# Patient Record
Sex: Female | Born: 1965 | Race: Black or African American | Hispanic: No | Marital: Single | Smoking: Current every day smoker
Health system: Southern US, Community
[De-identification: ages and names within clinical notes are randomized; demographics above are authoritative.]

## PROBLEM LIST (undated history)

## (undated) DIAGNOSIS — G5603 Carpal tunnel syndrome, bilateral upper limbs: Secondary | ICD-10-CM

## (undated) DIAGNOSIS — Z87898 Personal history of other specified conditions: Secondary | ICD-10-CM

## (undated) DIAGNOSIS — I10 Essential (primary) hypertension: Secondary | ICD-10-CM

## (undated) DIAGNOSIS — G8929 Other chronic pain: Secondary | ICD-10-CM

## (undated) DIAGNOSIS — R739 Hyperglycemia, unspecified: Secondary | ICD-10-CM

## (undated) HISTORY — DX: Personal history of other specified conditions: Z87.898

## (undated) HISTORY — DX: Hyperglycemia, unspecified: R73.9

## (undated) HISTORY — DX: Other chronic pain: G89.29

## (undated) HISTORY — PX: EYE SURGERY: SHX253

## (undated) HISTORY — DX: Carpal tunnel syndrome, bilateral upper limbs: G56.03

---

## 1999-11-24 ENCOUNTER — Encounter: Payer: Self-pay | Admitting: Emergency Medicine

## 1999-11-24 ENCOUNTER — Emergency Department (HOSPITAL_COMMUNITY): Admission: EM | Admit: 1999-11-24 | Discharge: 1999-11-24 | Payer: Self-pay | Admitting: *Deleted

## 2002-08-11 ENCOUNTER — Emergency Department (HOSPITAL_COMMUNITY): Admission: EM | Admit: 2002-08-11 | Discharge: 2002-08-11 | Payer: Self-pay | Admitting: Emergency Medicine

## 2002-08-30 ENCOUNTER — Other Ambulatory Visit: Admission: RE | Admit: 2002-08-30 | Discharge: 2002-08-30 | Payer: Self-pay | Admitting: Obstetrics and Gynecology

## 2002-09-11 ENCOUNTER — Encounter: Payer: Self-pay | Admitting: Obstetrics and Gynecology

## 2002-09-11 ENCOUNTER — Encounter: Admission: RE | Admit: 2002-09-11 | Discharge: 2002-09-11 | Payer: Self-pay | Admitting: Obstetrics and Gynecology

## 2004-12-19 ENCOUNTER — Emergency Department (HOSPITAL_COMMUNITY): Admission: EM | Admit: 2004-12-19 | Discharge: 2004-12-19 | Payer: Self-pay | Admitting: Emergency Medicine

## 2005-06-22 ENCOUNTER — Emergency Department (HOSPITAL_COMMUNITY): Admission: EM | Admit: 2005-06-22 | Discharge: 2005-06-22 | Payer: Self-pay | Admitting: Emergency Medicine

## 2007-07-15 ENCOUNTER — Encounter: Admission: RE | Admit: 2007-07-15 | Discharge: 2007-07-15 | Payer: Self-pay | Admitting: Family Medicine

## 2010-11-24 ENCOUNTER — Encounter: Payer: Self-pay | Admitting: *Deleted

## 2011-04-01 ENCOUNTER — Emergency Department (HOSPITAL_COMMUNITY)
Admission: EM | Admit: 2011-04-01 | Discharge: 2011-04-01 | Disposition: A | Discharge status: Home / Self Care | Payer: Self-pay | Attending: Internal Medicine | Admitting: Internal Medicine

## 2011-04-01 ENCOUNTER — Emergency Department (HOSPITAL_COMMUNITY): Payer: Self-pay

## 2011-04-01 DIAGNOSIS — I10 Essential (primary) hypertension: Secondary | ICD-10-CM | POA: Insufficient documentation

## 2011-04-01 DIAGNOSIS — R51 Headache: Secondary | ICD-10-CM | POA: Insufficient documentation

## 2011-04-01 LAB — URINALYSIS, ROUTINE W REFLEX MICROSCOPIC
Bilirubin Urine: NEGATIVE
Glucose, UA: NEGATIVE mg/dL
Ketones, ur: NEGATIVE mg/dL
pH: 7 (ref 5.0–8.0)

## 2011-04-01 LAB — BASIC METABOLIC PANEL
BUN: 13 mg/dL (ref 6–23)
Chloride: 100 mEq/L (ref 96–112)
Glucose, Bld: 91 mg/dL (ref 70–99)
Potassium: 3.5 mEq/L (ref 3.5–5.1)

## 2011-05-07 ENCOUNTER — Other Ambulatory Visit: Payer: Self-pay | Admitting: Obstetrics & Gynecology

## 2011-05-07 DIAGNOSIS — Z1231 Encounter for screening mammogram for malignant neoplasm of breast: Secondary | ICD-10-CM

## 2011-05-18 ENCOUNTER — Ambulatory Visit (HOSPITAL_COMMUNITY)
Admission: RE | Admit: 2011-05-18 | Discharge: 2011-05-18 | Disposition: A | Discharge status: Home / Self Care | Payer: Self-pay | Source: Ambulatory Visit | Attending: Obstetrics & Gynecology | Admitting: Obstetrics & Gynecology

## 2011-05-18 DIAGNOSIS — Z1231 Encounter for screening mammogram for malignant neoplasm of breast: Secondary | ICD-10-CM

## 2012-07-12 IMAGING — CR DG CHEST 2V
2 series · 2 of 2 positions shown · non-contrast
Comparison: None.

CLINICAL DATA: Hypertension/headache

CHEST - 2 VIEW

[w chest pa]
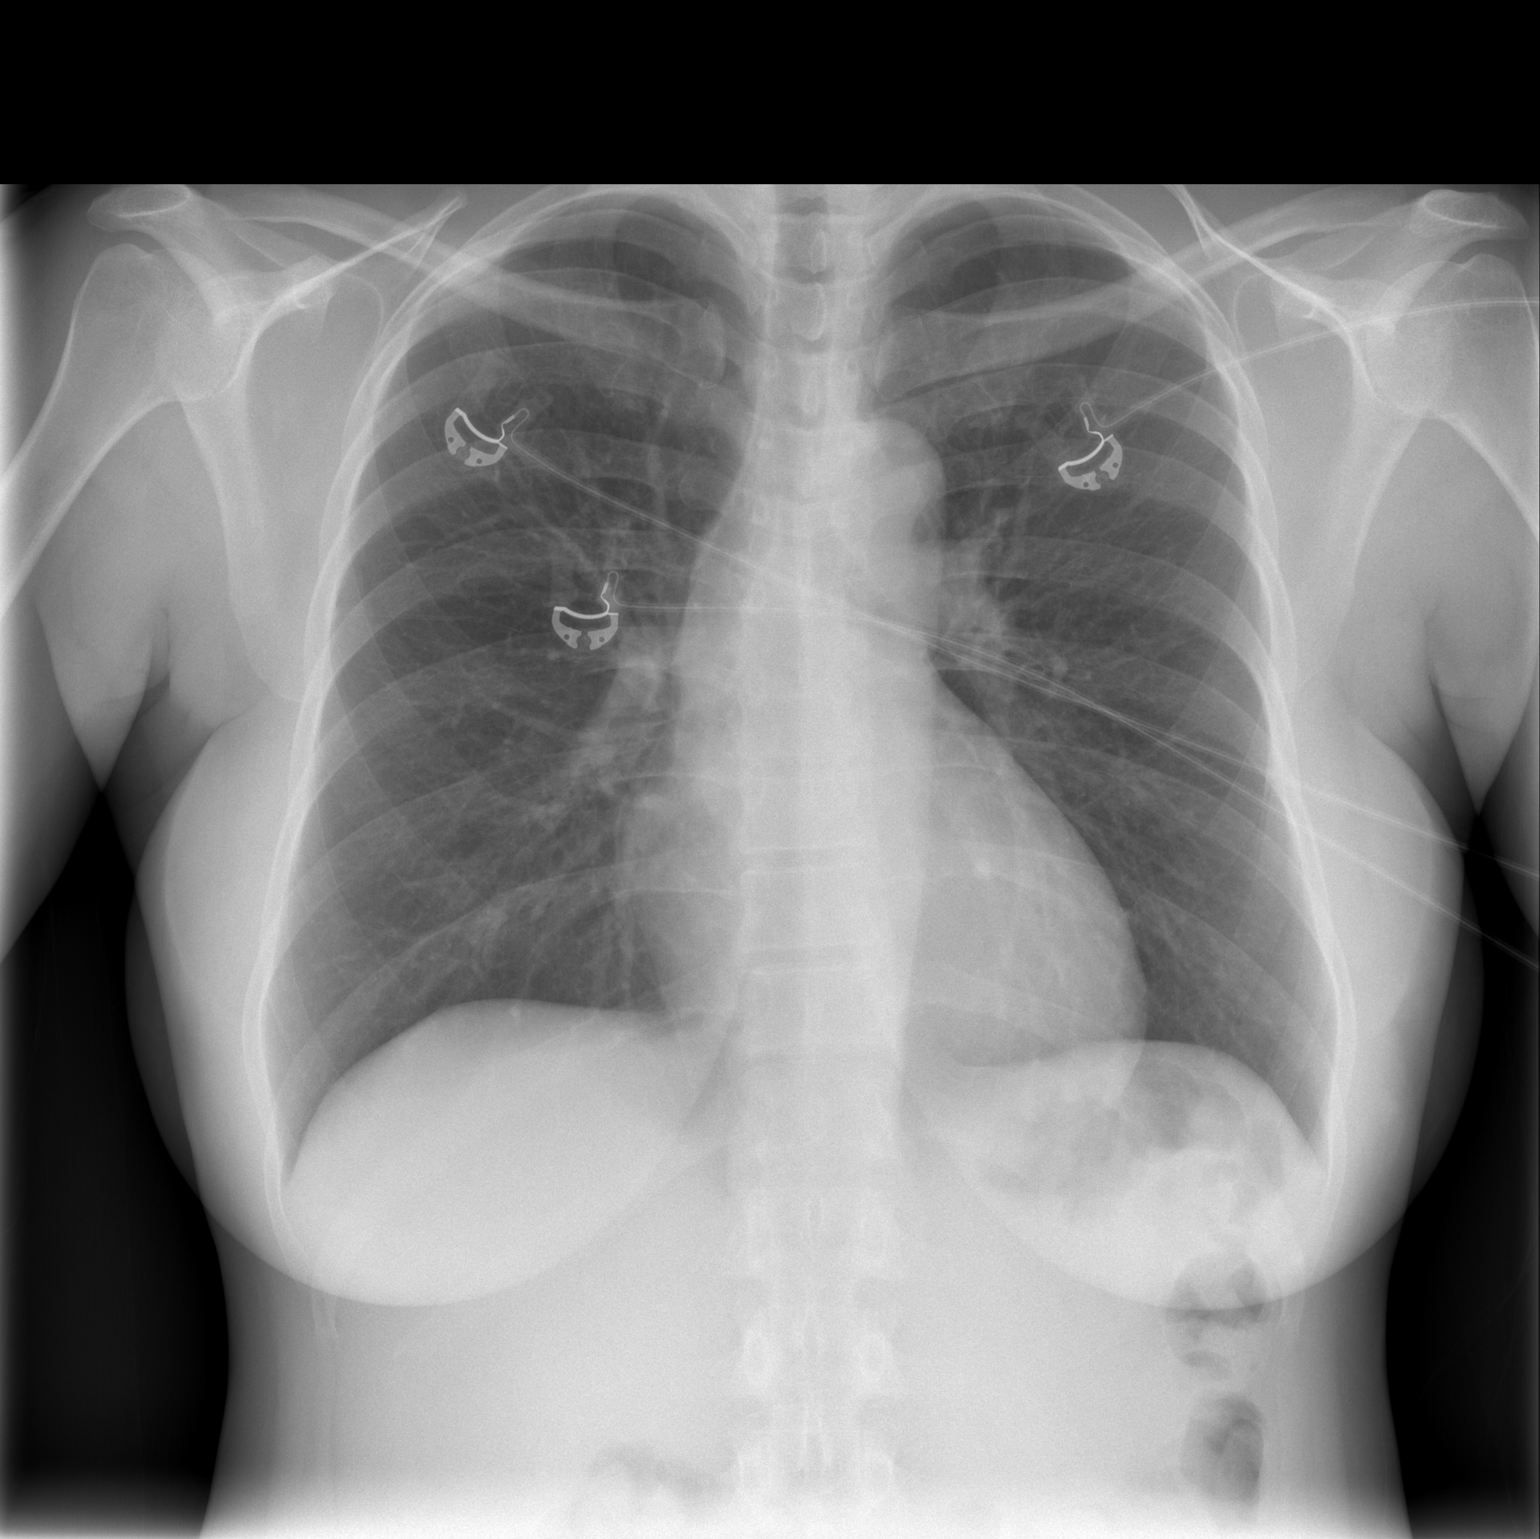

[w chest lat]
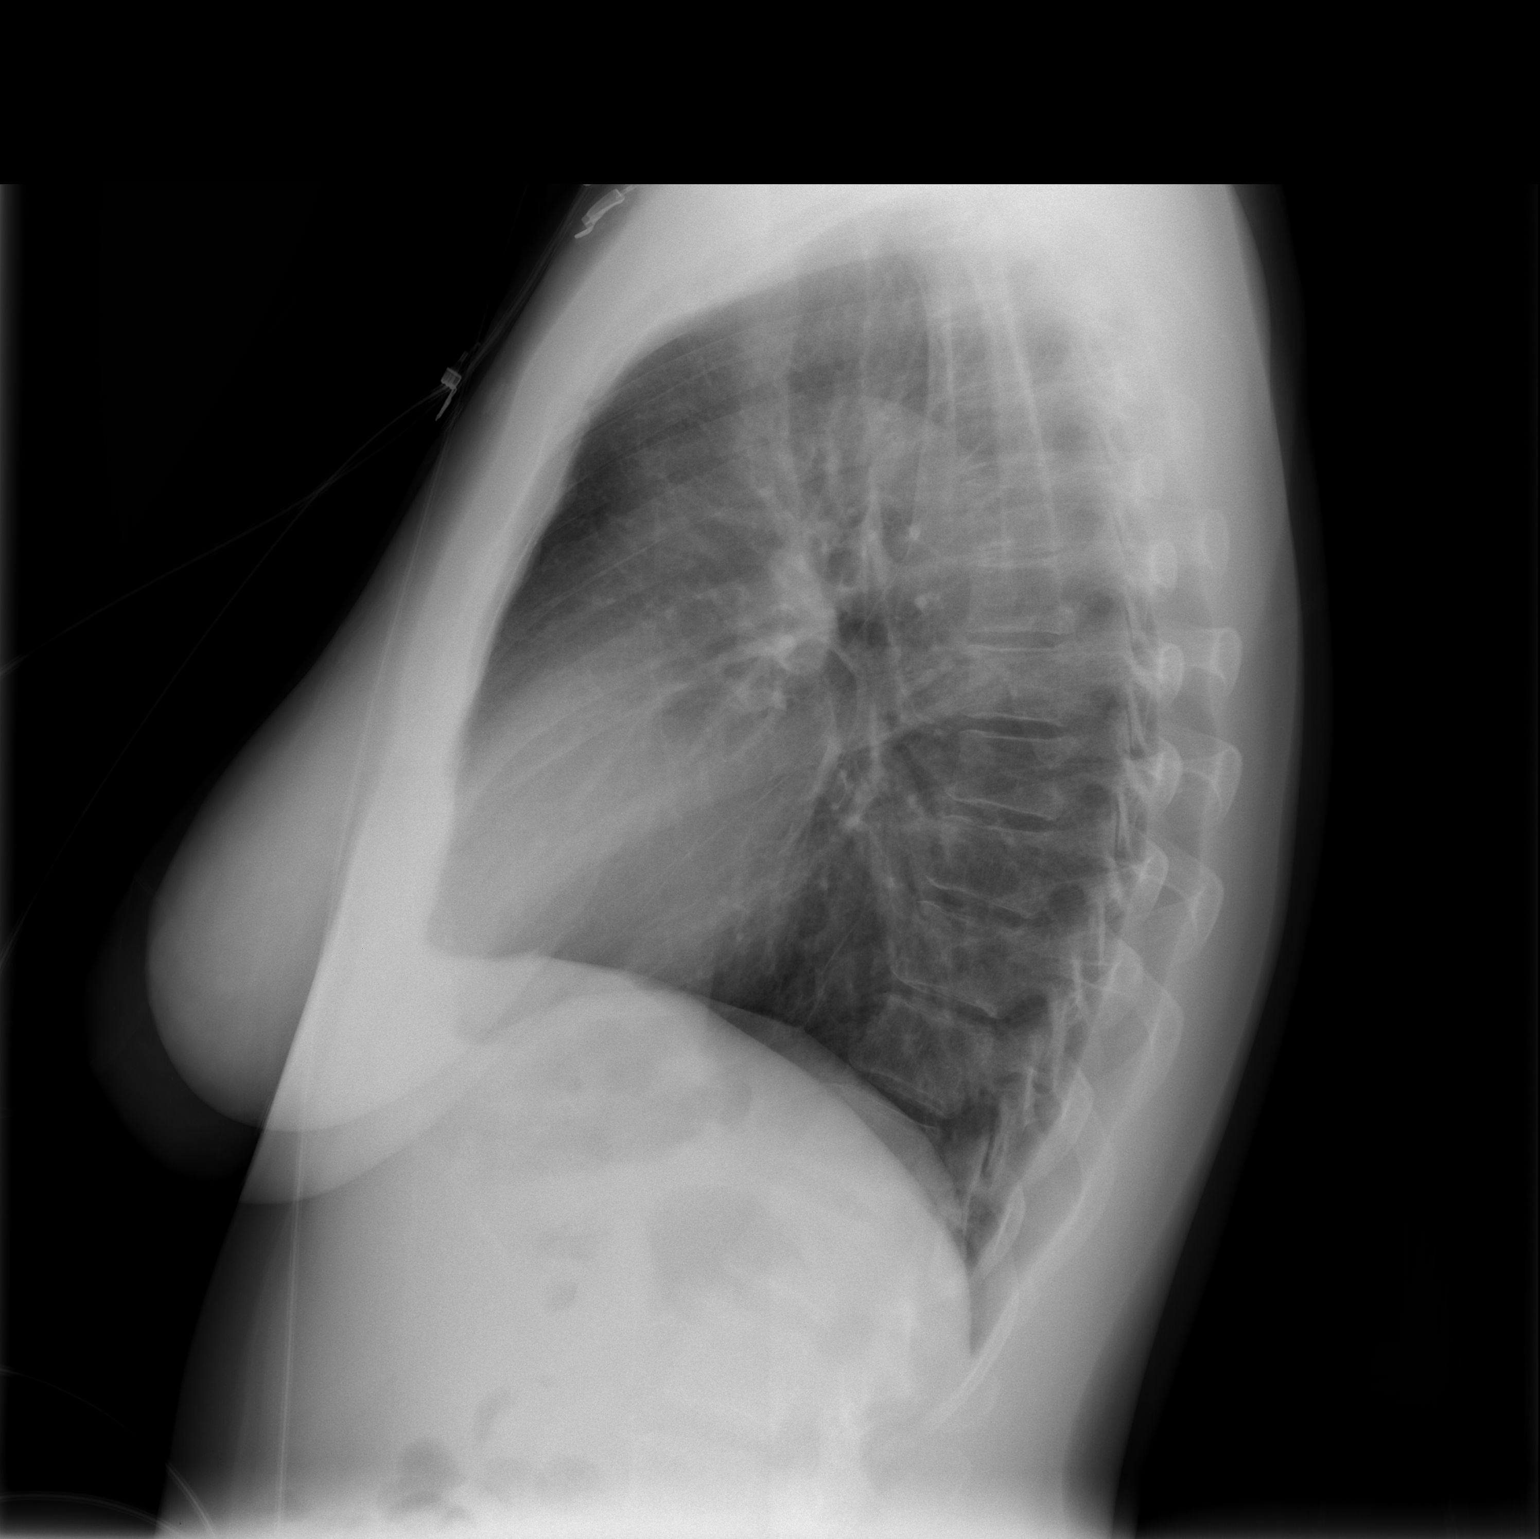

[2 of 2 positions shown; findings below may reference images not displayed]

FINDINGS: Heart and mediastinal contours normal.  Lungs clear.  No
pleural fluid.  Osseous structures and soft tissues unremarkable.
IMPRESSION: No acute or significant findings.

## 2012-07-12 IMAGING — CT CT HEAD W/O CM
1 of 2 series · 16 of 30 positions shown, 20 images · non-contrast
Comparison: None.

CLINICAL DATA: Hypertension/headache

CT HEAD WITHOUT CONTRAST
TECHNIQUE: Contiguous axial images were obtained from the base of
the skull through the vertex without contrast.

[Series 2: head_seq 4.5 h37s st · axial · 0.43mm/px · z∈[-163,-19]mm · 16 of 36 slices shown, 20 images]
[im 2/36  brain]
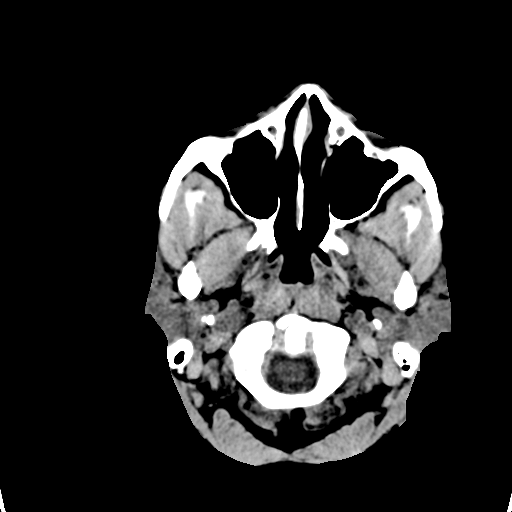
[im 2/36  bone]
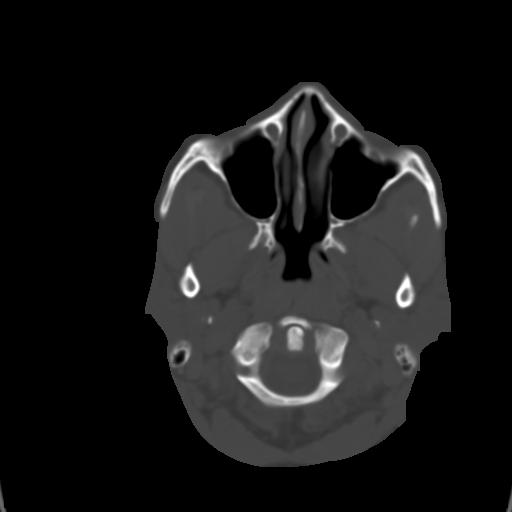
[im 4/36  brain]
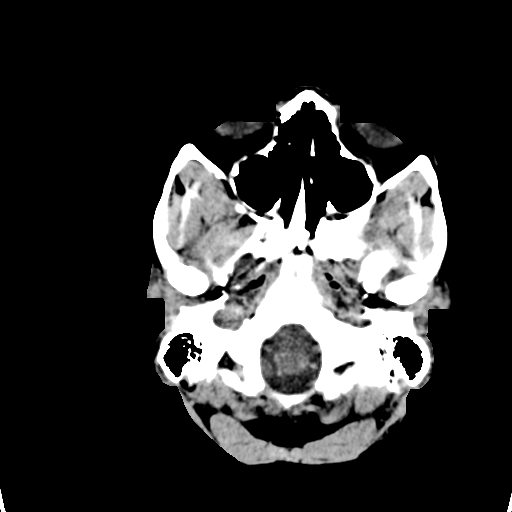
[im 6/36  brain]
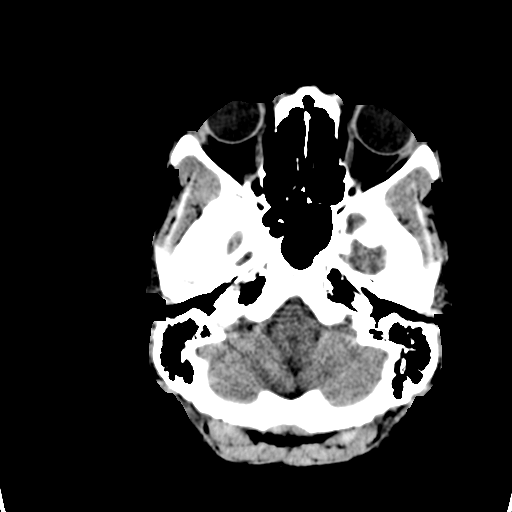
[im 9/36  brain]
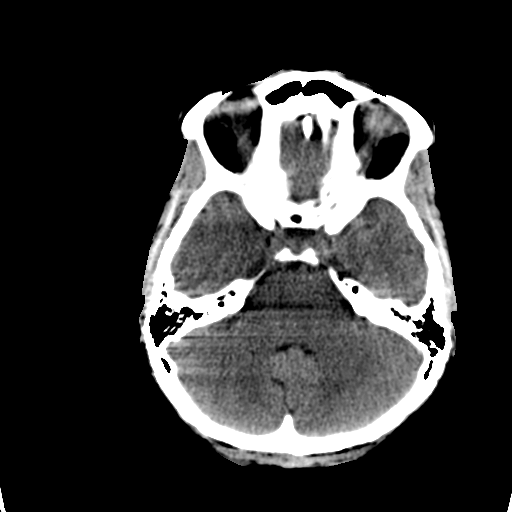
[im 11/36  brain]
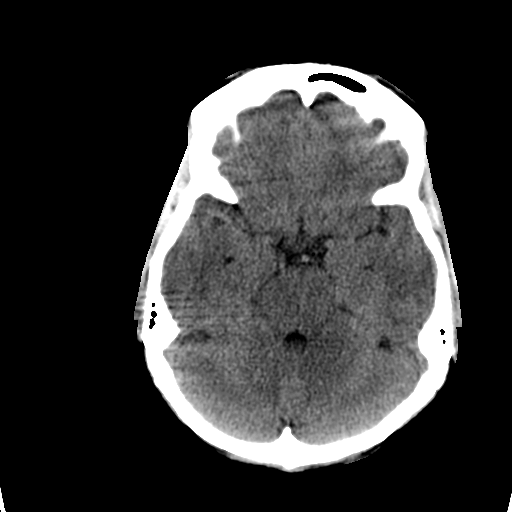
[im 11/36  bone]
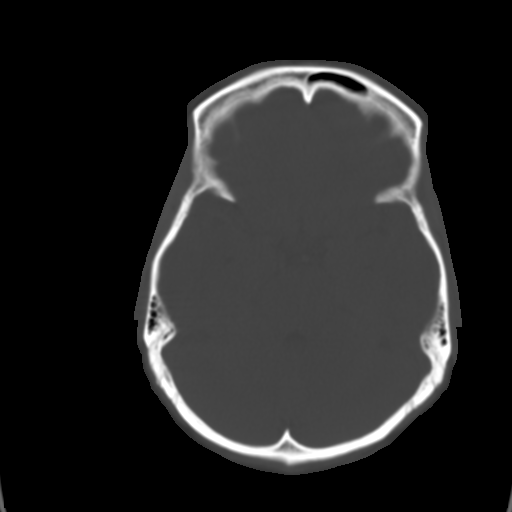
[im 13/36  brain]
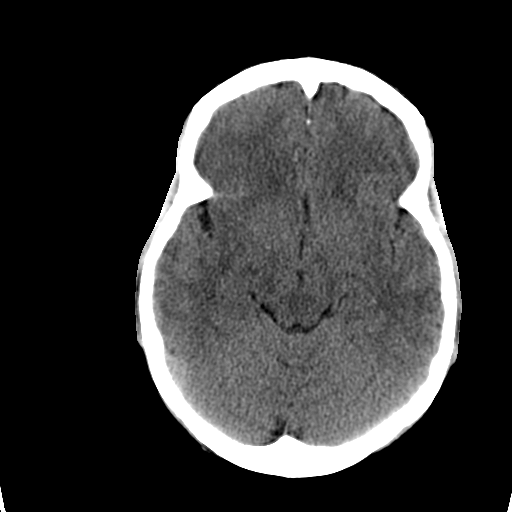
[im 15/36  brain]
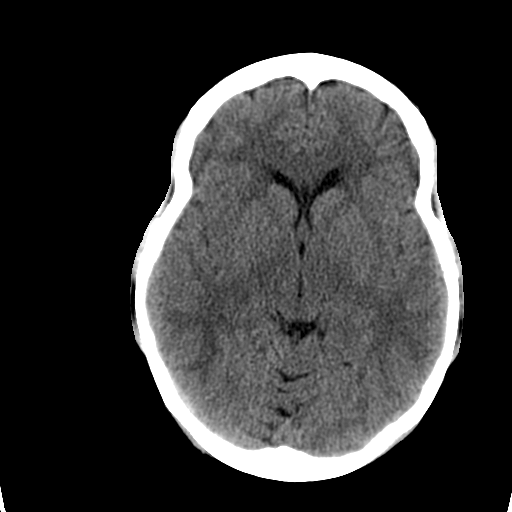
[im 16/36  brain]
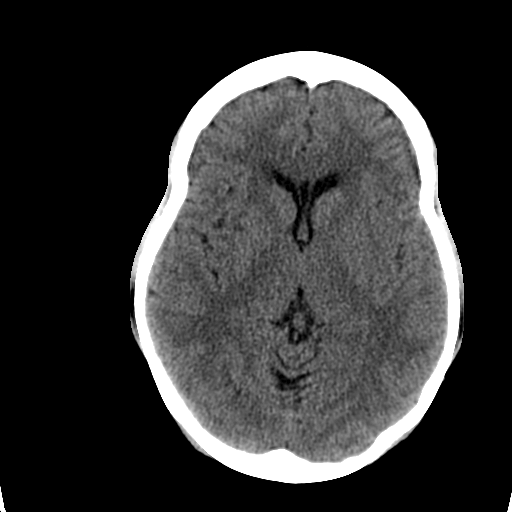
[im 20/36  brain]
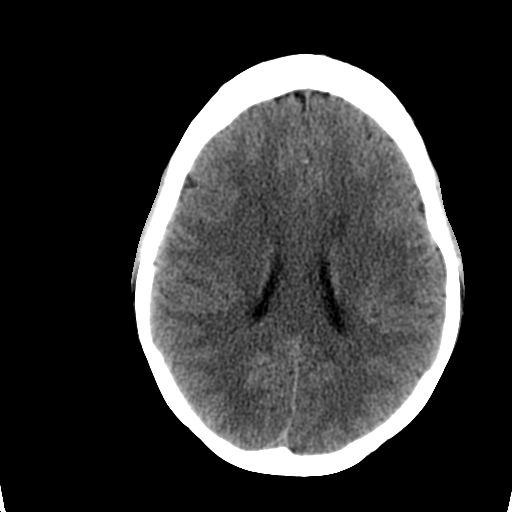
[im 20/36  bone]
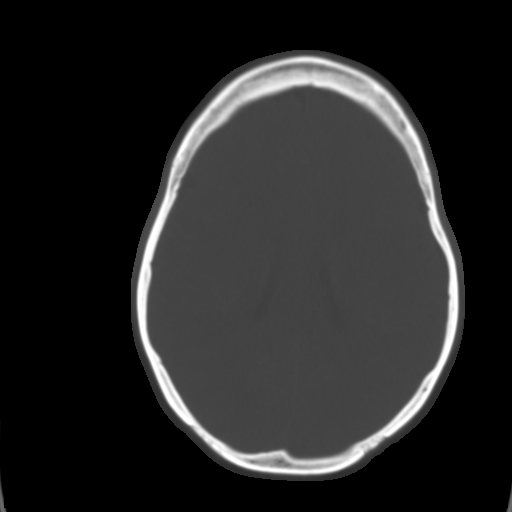
[im 22/36  brain]
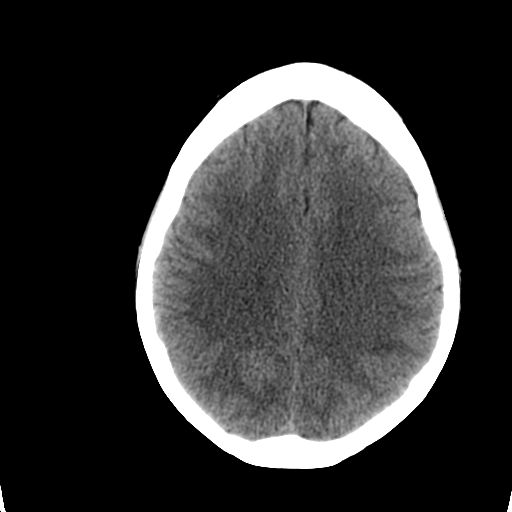
[im 23/36  brain]
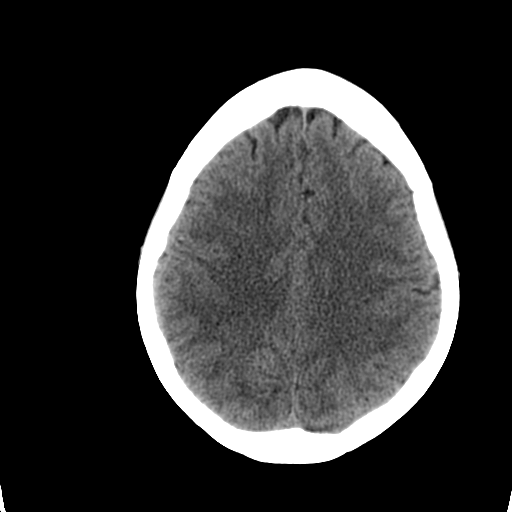
[im 25/36  brain]
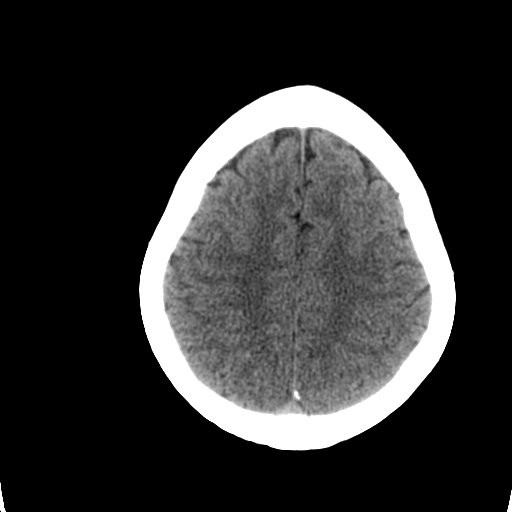
[im 27/36  brain]
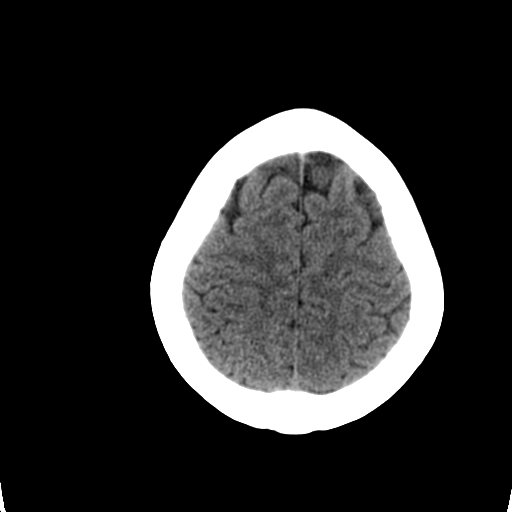
[im 27/36  bone]
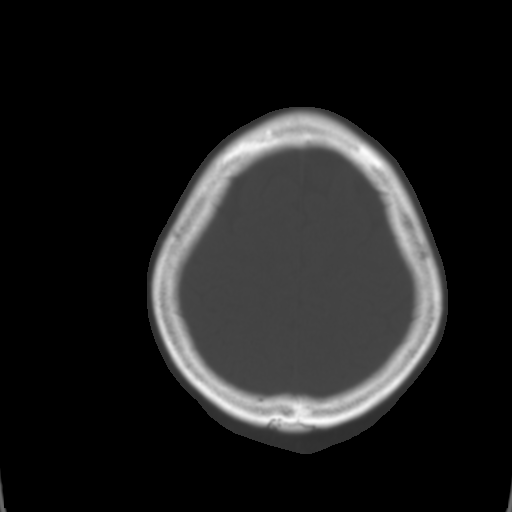
[im 30/36  brain]
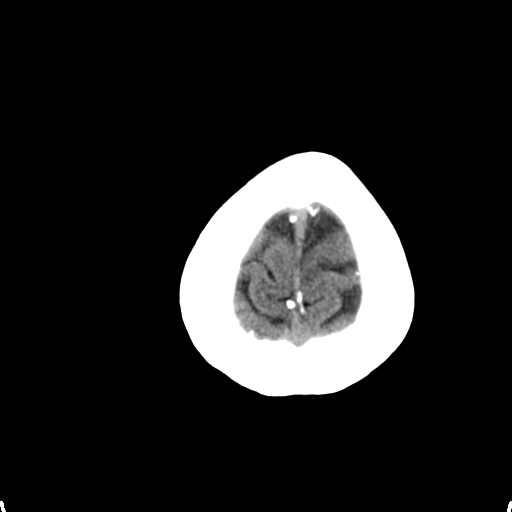
[im 32/36  brain]
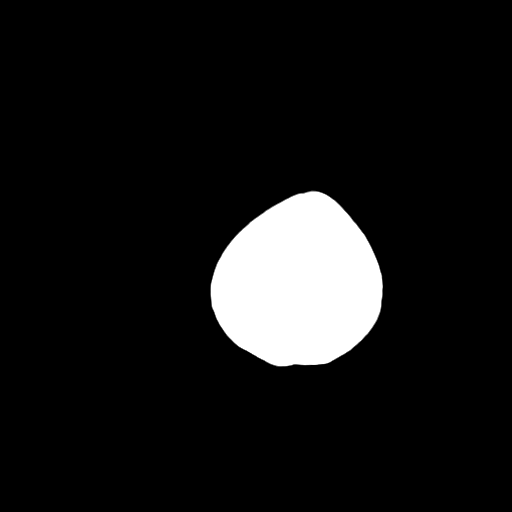
[im 34/36  brain]
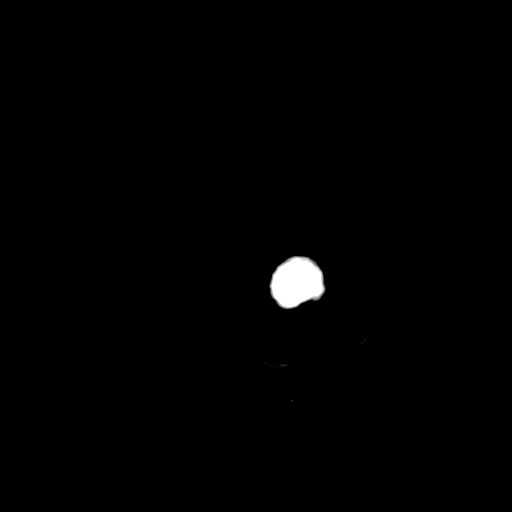

[16 of 30 positions shown; findings below may reference images not displayed]

FINDINGS: Ventricular size and CSF spaces normal.

 No evidence for acute infarct, hemorrhage, or mass lesion. No
extra-axial fluid collections or midline shift.  Calvarium intact.
No fluid in the sinuses visualized.
IMPRESSION: No acute or significant findings.

## 2012-07-15 ENCOUNTER — Other Ambulatory Visit (HOSPITAL_COMMUNITY): Payer: Self-pay | Admitting: Physician Assistant

## 2012-07-15 DIAGNOSIS — Z1231 Encounter for screening mammogram for malignant neoplasm of breast: Secondary | ICD-10-CM

## 2012-07-25 ENCOUNTER — Ambulatory Visit (HOSPITAL_COMMUNITY): Payer: Self-pay

## 2012-08-04 ENCOUNTER — Ambulatory Visit (HOSPITAL_COMMUNITY)
Admission: RE | Admit: 2012-08-04 | Discharge: 2012-08-04 | Disposition: A | Discharge status: Home / Self Care | Payer: Self-pay | Source: Ambulatory Visit | Attending: Physician Assistant | Admitting: Physician Assistant

## 2012-08-04 DIAGNOSIS — Z1231 Encounter for screening mammogram for malignant neoplasm of breast: Secondary | ICD-10-CM

## 2012-08-24 ENCOUNTER — Emergency Department (HOSPITAL_COMMUNITY)
Admission: EM | Admit: 2012-08-24 | Discharge: 2012-08-24 | Disposition: A | Discharge status: Home / Self Care | Payer: Self-pay | Attending: Emergency Medicine | Admitting: Emergency Medicine

## 2012-08-24 ENCOUNTER — Encounter (HOSPITAL_COMMUNITY): Payer: Self-pay | Admitting: Emergency Medicine

## 2012-08-24 DIAGNOSIS — R21 Rash and other nonspecific skin eruption: Secondary | ICD-10-CM | POA: Insufficient documentation

## 2012-08-24 DIAGNOSIS — F17200 Nicotine dependence, unspecified, uncomplicated: Secondary | ICD-10-CM | POA: Insufficient documentation

## 2012-08-24 DIAGNOSIS — I10 Essential (primary) hypertension: Secondary | ICD-10-CM | POA: Insufficient documentation

## 2012-08-24 HISTORY — DX: Essential (primary) hypertension: I10

## 2012-08-24 MED ORDER — DOXYCYCLINE HYCLATE 100 MG PO CAPS: 100.0000 mg | ORAL_CAPSULE | Freq: Two times a day (BID) | ORAL | Status: DC

## 2012-08-24 MED ORDER — DIPHENHYDRAMINE HCL 25 MG PO TABS: 25.0000 mg | ORAL_TABLET | Freq: Four times a day (QID) | ORAL | Status: DC

## 2012-08-24 MED ORDER — PREDNISONE 10 MG PO TABS: 20.0000 mg | ORAL_TABLET | Freq: Every day | ORAL | Status: DC

## 2012-08-24 NOTE — ED Provider Notes (Signed)
History     CSN: [id number]  Arrival date & time 08/24/12  0944   First MD Initiated Contact with Patient 08/24/12 1007      No chief complaint on file.   (Consider location/radiation/quality/duration/timing/severity/associated sxs/prior treatment) HPI  46 year old female presents complaining of rash. Patient reports for the past 6 months she has had a persistent rash. Rash developed initially on her bilateral forearm and has spread throughout her back abdomen and her lower legs. She is very itchy. Onset was gradual, persistent, worsening, with moderate severity. She denies headache, fever, nausea, vomiting, diarrhea, abdominal pain. She denies any change in her environment, new detergent, or new cleaning products. He has been seen by her primary care Dr. for this problem. She was prescribed prednisone, antibiotic, and cream which has not provided any relief.  No one else in family with similar rash.  No past medical history on file.  No past surgical history on file.  No family history on file.  History  Substance Use Topics  . Smoking status: Not on file  . Smokeless tobacco: Not on file  . Alcohol Use: Not on file    OB History    No data available      Review of Systems  Constitutional: Negative for fever.  HENT: Negative for sore throat.   Respiratory: Negative for shortness of breath.   Cardiovascular: Negative for chest pain.  Gastrointestinal: Negative for nausea, vomiting and abdominal pain.  Skin: Positive for rash.  Neurological: Negative for numbness.    Allergies  Review of patient's allergies indicates no known allergies.  Home Medications  No current outpatient prescriptions on file.  LMP 07/21/2012  Physical Exam  Nursing note and vitals reviewed. Constitutional: She is oriented to person, place, and time. She appears well-developed and well-nourished. No distress.  HENT:  Head: Normocephalic and atraumatic.  Mouth/Throat: Oropharynx is clear  and moist. No oropharyngeal exudate.  Eyes: Conjunctivae normal are normal.       Trabismus noted  Neck: Normal range of motion. Neck supple.  Pulmonary/Chest: Effort normal. No respiratory distress. She exhibits no tenderness.  Abdominal: Soft.  Musculoskeletal: She exhibits no edema.  Neurological: She is alert and oriented to person, place, and time.  Skin: Skin is warm. Rash (Diffuse maculopapular rash noted to bilateral forearm, abdomen, lower back, bilateral legs with multiple excoriation marks throughout. Non-petechial, pustular, or vesicular lesion.  No rash in palms of hands or soles of the) noted.    ED Course  Procedures (including critical care time)  Labs Reviewed - No data to display No results found.   No diagnosis found.  1. rash  MDM  Persistent rash x 6 months.  No red flags.  Will refer to dermatology for further care.  Will prescribe prednisone, abx, and cream for sxs treatment.  Pt agrees with plan.     BP 146/81  Pulse 50  Temp 97.8 F (36.6 C) (Oral)  Resp 18  Wt 142 lb (64.411 kg)  SpO2 100%  LMP 08/23/2012      Fayrene Helper, PA-C 08/24/12 1111

## 2012-08-24 NOTE — ED Notes (Signed)
Pt reports increased itching and red, raised rash x 5 months. Pt was seen by PCP , intermittent tx with prednisone unsuccessful

## 2012-08-24 NOTE — ED Provider Notes (Signed)
Medical screening examination/treatment/procedure(s) were performed by non-physician practitioner and as supervising physician I was immediately available for consultation/collaboration.  Tobin Chad, MD 08/24/12 [phone]

## 2015-04-23 ENCOUNTER — Other Ambulatory Visit: Payer: Self-pay | Admitting: Obstetrics & Gynecology

## 2015-04-23 DIAGNOSIS — B009 Herpesviral infection, unspecified: Secondary | ICD-10-CM | POA: Insufficient documentation

## 2015-04-23 DIAGNOSIS — N63 Unspecified lump in unspecified breast: Secondary | ICD-10-CM

## 2015-04-26 ENCOUNTER — Ambulatory Visit
Admission: RE | Admit: 2015-04-26 | Discharge: 2015-04-26 | Disposition: A | Discharge status: Home / Self Care | Payer: 59 | Source: Ambulatory Visit | Attending: Obstetrics & Gynecology | Admitting: Obstetrics & Gynecology

## 2015-04-26 DIAGNOSIS — N63 Unspecified lump in unspecified breast: Secondary | ICD-10-CM

## 2015-06-04 ENCOUNTER — Encounter: Payer: Self-pay | Admitting: Internal Medicine

## 2015-06-05 ENCOUNTER — Other Ambulatory Visit: Payer: Self-pay | Admitting: Internal Medicine

## 2015-06-05 DIAGNOSIS — E2839 Other primary ovarian failure: Secondary | ICD-10-CM

## 2015-06-14 ENCOUNTER — Other Ambulatory Visit: Payer: 59

## 2015-08-20 ENCOUNTER — Encounter: Payer: 59 | Admitting: Internal Medicine

## 2016-01-30 ENCOUNTER — Encounter (HOSPITAL_COMMUNITY): Payer: Self-pay | Admitting: Emergency Medicine

## 2016-01-30 ENCOUNTER — Emergency Department (HOSPITAL_COMMUNITY)
Admission: EM | Admit: 2016-01-30 | Discharge: 2016-01-30 | Disposition: A | Discharge status: Home / Self Care | Payer: Self-pay | Attending: Emergency Medicine | Admitting: Emergency Medicine

## 2016-01-30 DIAGNOSIS — F17210 Nicotine dependence, cigarettes, uncomplicated: Secondary | ICD-10-CM | POA: Insufficient documentation

## 2016-01-30 DIAGNOSIS — R112 Nausea with vomiting, unspecified: Secondary | ICD-10-CM | POA: Insufficient documentation

## 2016-01-30 DIAGNOSIS — Z79899 Other long term (current) drug therapy: Secondary | ICD-10-CM | POA: Insufficient documentation

## 2016-01-30 DIAGNOSIS — H81399 Other peripheral vertigo, unspecified ear: Secondary | ICD-10-CM | POA: Insufficient documentation

## 2016-01-30 DIAGNOSIS — I10 Essential (primary) hypertension: Secondary | ICD-10-CM | POA: Insufficient documentation

## 2016-01-30 LAB — CBC WITH DIFFERENTIAL/PLATELET
BASOS ABS: 0 10*3/uL (ref 0.0–0.1)
Basophils Relative: 0 %
Eosinophils Absolute: 0.1 10*3/uL (ref 0.0–0.7)
Eosinophils Relative: 1 %
HEMATOCRIT: 37 % (ref 36.0–46.0)
Hemoglobin: 12.8 g/dL (ref 12.0–15.0)
LYMPHS PCT: 21 %
Lymphs Abs: 1 10*3/uL (ref 0.7–4.0)
MCH: 30.7 pg (ref 26.0–34.0)
MCHC: 34.6 g/dL (ref 30.0–36.0)
MCV: 88.7 fL (ref 78.0–100.0)
MONO ABS: 0.3 10*3/uL (ref 0.1–1.0)
Monocytes Relative: 6 %
NEUTROS ABS: 3.6 10*3/uL (ref 1.7–7.7)
Neutrophils Relative %: 72 %
Platelets: 285 10*3/uL (ref 150–400)
RBC: 4.17 MIL/uL (ref 3.87–5.11)
RDW: 13.6 % (ref 11.5–15.5)
WBC: 5 10*3/uL (ref 4.0–10.5)

## 2016-01-30 LAB — BASIC METABOLIC PANEL
ANION GAP: 10 (ref 5–15)
BUN: 13 mg/dL (ref 6–20)
CHLORIDE: 103 mmol/L (ref 101–111)
CO2: 25 mmol/L (ref 22–32)
Calcium: 9.6 mg/dL (ref 8.9–10.3)
Creatinine, Ser: 0.86 mg/dL (ref 0.44–1.00)
GFR calc Af Amer: >60 mL/min (ref >60)
GFR calc non Af Amer: >60 mL/min (ref >60)
GLUCOSE: 102 mg/dL — AB (ref 65–99)
POTASSIUM: 4.1 mmol/L (ref 3.5–5.1)
Sodium: 138 mmol/L (ref 135–145)

## 2016-01-30 MED ORDER — MECLIZINE HCL 25 MG PO TABS: 25.0000 mg | ORAL_TABLET | Freq: Three times a day (TID) | ORAL | Status: DC | PRN

## 2016-01-30 MED ORDER — CLONIDINE HCL 0.1 MG PO TABS
0.1000 mg | ORAL_TABLET | Freq: Once | ORAL | Status: AC
  Administered 2016-01-30: 0.1 mg via ORAL
  Filled 2016-01-30: qty 1

## 2016-01-30 MED ORDER — ONDANSETRON HCL 4 MG PO TABS: 4.0000 mg | ORAL_TABLET | Freq: Four times a day (QID) | ORAL | Status: DC

## 2016-01-30 MED ORDER — MECLIZINE HCL 25 MG PO TABS
25.0000 mg | ORAL_TABLET | Freq: Once | ORAL | Status: AC
  Administered 2016-01-30: 25 mg via ORAL
  Filled 2016-01-30: qty 1

## 2016-01-30 NOTE — Discharge Instructions (Signed)
Benign Positional Vertigo Vertigo is the feeling that you or your surroundings are moving when they are not. Benign positional vertigo is the most common form of vertigo. The cause of this condition is not serious (is benign). This condition is triggered by certain movements and positions (is positional). This condition can be dangerous if it occurs while you are doing something that could endanger you or others, such as driving.  CAUSES In many cases, the cause of this condition is not known. It may be caused by a disturbance in an area of the inner ear that helps your brain to sense movement and balance. This disturbance can be caused by a viral infection (labyrinthitis), head injury, or repetitive motion. RISK FACTORS This condition is more likely to develop in:  Women.  People who are 50 years of age or older. SYMPTOMS Symptoms of this condition usually happen when you move your head or your eyes in different directions. Symptoms may start suddenly, and they usually last for less than a minute. Symptoms may include:  Loss of balance and falling.  Feeling like you are spinning or moving.  Feeling like your surroundings are spinning or moving.  Nausea and vomiting.  Blurred vision.  Dizziness.  Involuntary eye movement (nystagmus). Symptoms can be mild and cause only slight annoyance, or they can be severe and interfere with daily life. Episodes of benign positional vertigo may return (recur) over time, and they may be triggered by certain movements. Symptoms may improve over time. DIAGNOSIS This condition is usually diagnosed by medical history and a physical exam of the head, neck, and ears. You may be referred to a health care provider who specializes in ear, nose, and throat (ENT) problems (otolaryngologist) or a provider who specializes in disorders of the nervous system (neurologist). You may have additional testing, including:  MRI.  A CT scan.  Eye movement tests. Your  health care provider may ask you to change positions quickly while he or she watches you for symptoms of benign positional vertigo, such as nystagmus. Eye movement may be tested with an electronystagmogram (ENG), caloric stimulation, the Dix-Hallpike test, or the roll test.  An electroencephalogram (EEG). This records electrical activity in your brain.  Hearing tests. TREATMENT Usually, your health care provider will treat this by moving your head in specific positions to adjust your inner ear back to normal. Surgery may be needed in severe cases, but this is rare. In some cases, benign positional vertigo may resolve on its own in 2-4 weeks. HOME CARE INSTRUCTIONS Safety  Move slowly.Avoid sudden body or head movements.  Avoid driving.  Avoid operating heavy machinery.  Avoid doing any tasks that would be dangerous to you or others if a vertigo episode would occur.  If you have trouble walking or keeping your balance, try using a cane for stability. If you feel dizzy or unstable, sit down right away.  Return to your normal activities as told by your health care provider. Ask your health care provider what activities are safe for you. General Instructions  Take over-the-counter and prescription medicines only as told by your health care provider.  Avoid certain positions or movements as told by your health care provider.  Drink enough fluid to keep your urine clear or pale yellow.  Keep all follow-up visits as told by your health care provider. This is important. SEEK MEDICAL CARE IF:  You have a fever.  Your condition gets worse or you develop new symptoms.  Your family or friends   notice any behavioral changes.  Your nausea or vomiting gets worse.  You have numbness or a "pins and needles" sensation. SEEK IMMEDIATE MEDICAL CARE IF:  You have difficulty speaking or moving.  You are always dizzy.  You faint.  You develop severe headaches.  You have weakness in your  legs or arms.  You have changes in your hearing or vision.  You develop a stiff neck.  You develop sensitivity to light.   This information is not intended to replace advice given to you by your health care provider. Make sure you discuss any questions you have with your health care provider.   Document Released: 07/27/2006 Document Revised: 07/10/2015 Document Reviewed: 02/11/2015 Elsevier Interactive Patient Education 2016 Elsevier Inc[employer]  

## 2016-01-30 NOTE — ED Notes (Signed)
MD at bedside. 

## 2016-01-30 NOTE — ED Provider Notes (Signed)
CSN: [id number]     Arrival date & time 01/30/16  0905 History   First MD Initiated Contact with Patient 01/30/16 [phone]     Chief Complaint  Patient presents with  . Dizziness     (Consider location/radiation/quality/duration/timing/severity/associated sxs/prior Treatment) Patient is a 50 y.o. female presenting with dizziness. The history is provided by the patient.  Dizziness Quality:  Room spinning and head spinning Severity:  Severe Onset quality:  Gradual Duration:  8 hours Timing:  Constant Progression:  Waxing and waning Chronicity:  New Context comment:  Rolled over in bed last night and felt dizzy but much worse this morning when trying to get out of bed and walk Relieved by:  Being still Worsened by:  Eye movement, sitting upright, standing up, turning head and movement Ineffective treatments:  None tried Associated symptoms: nausea and vomiting   Associated symptoms: no chest pain, no headaches, no palpitations, no shortness of breath, no syncope, no tinnitus, no vision changes and no weakness   Risk factors: no heart disease and no hx of stroke     Past Medical History  Diagnosis Date  . Hypertension    Past Surgical History  Procedure Laterality Date  . Eye surgery     Family History  Problem Relation Age of Onset  . Hypertension Mother   . Hypertension Father   . Diabetes Father   . Diabetes Sister    Social History  Substance Use Topics  . Smoking status: Current Every Day Smoker -- 0.50 packs/day    Types: Cigarettes  . Smokeless tobacco: None  . Alcohol Use: No   OB History    No data available     Review of Systems  HENT: Negative for tinnitus.   Respiratory: Negative for shortness of breath.   Cardiovascular: Negative for chest pain, palpitations and syncope.  Gastrointestinal: Positive for nausea and vomiting.  Neurological: Positive for dizziness. Negative for weakness and headaches.  All other systems reviewed and are  negative.     Allergies  Review of patient's allergies indicates no known allergies.  Home Medications   Prior to Admission medications   Medication Sig Start Date End Date Taking? Authorizing Provider  cloNIDine (CATAPRES) 0.1 MG tablet Take 0.1 mg by mouth 2 (two) times daily.   Yes Historical Provider, MD  esomeprazole (NEXIUM) 20 MG capsule Take 20 mg by mouth daily.   Yes Historical Provider, MD  hydrochlorothiazide (HYDRODIURIL) 25 MG tablet Take 25 mg by mouth daily.   Yes Historical Provider, MD  meloxicam (MOBIC) 15 MG tablet Take 15 mg by mouth daily.   Yes Historical Provider, MD  atenolol (TENORMIN) 50 MG tablet Take 50 mg by mouth daily.    Historical Provider, MD  diphenhydrAMINE (BENADRYL) 25 MG tablet Take 1 tablet (25 mg total) by mouth every 6 (six) hours. Patient not taking: Reported on 01/30/2016 08/24/12   Fayrene HelperBowie Tran, PA-C  doxycycline (VIBRAMYCIN) 100 MG capsule Take 1 capsule (100 mg total) by mouth 2 (two) times daily. Patient not taking: Reported on 01/30/2016 08/24/12   Fayrene HelperBowie Tran, PA-C  predniSONE (DELTASONE) 10 MG tablet Take 2 tablets (20 mg total) by mouth daily. Patient not taking: Reported on 01/30/2016 08/24/12   Fayrene HelperBowie Tran, PA-C   BP 173/105 mmHg  Pulse 52  Temp(Src) 97.4 F (36.3 C) (Oral)  Resp 9  SpO2 98%  LMP 05/30/2015 Physical Exam  Constitutional: She is oriented to person, place, and time. She appears well-developed and well-nourished. No distress.  HENT:  Head: Normocephalic and atraumatic.  Mouth/Throat: Oropharynx is clear and moist.  Eyes: Conjunctivae are normal. Pupils are equal, round, and reactive to light.  Unable to laterally deviate the left eye.  EOMI otherwise.    Neck: Normal range of motion. Neck supple. No spinous process tenderness and no muscular tenderness present. Carotid bruit is not present.  Cardiovascular: Normal rate, regular rhythm and intact distal pulses.   No murmur heard. Pulses equal in all ext   Pulmonary/Chest: Effort normal and breath sounds normal. No respiratory distress. She has no wheezes. She has no rales.  Abdominal: Soft. She exhibits no distension. There is no tenderness. There is no rebound and no guarding.  Musculoskeletal: Normal range of motion. She exhibits no edema or tenderness.  Neurological: She is alert and oriented to person, place, and time. She has normal strength. No cranial nerve deficit or sensory deficit. Coordination normal.  Skin: Skin is warm and dry. No rash noted. No erythema.  Psychiatric: She has a normal mood and affect. Her behavior is normal.  Nursing note and vitals reviewed.   ED Course  Procedures (including critical care time) Labs Review Labs Reviewed  BASIC METABOLIC PANEL - Abnormal; Notable for the following:    Glucose, Bld 102 (*)    All other components within normal limits  CBC WITH DIFFERENTIAL/PLATELET    Imaging Review No results found. I have personally reviewed and evaluated these images and lab results as part of my medical decision-making.   EKG Interpretation   Date/Time:  Thursday January 30 2016 09:09:24 EDT Ventricular Rate:  46 PR Interval:  149 QRS Duration: 92 QT Interval:  492 QTC Calculation: 430 R Axis:   -32 Text Interpretation:  Sinus bradycardia Left axis deviation No significant  change since last tracing Confirmed by Anitra Lauth  MD, Alphonzo Lemmings ([id number]) on  01/30/2016 9:51:43 AM      MDM   Final diagnoses:  None    Pt with sx most consistent with peripheral vertigo.  No systemic or infectious sx.  Normal neuro exam without weakness, ataxia or cerebellar findings on exam.  Normal vision but abnormal EOM which is baseline for pt from birth.  Sx are reproducible with movement of the head and attempting to walk.  No hx of Stroke and low likelihood. Only risk factor is HTN.  Pt HTN today but vomited her meds.  Will give a dose of her clonidine. Will treat for peripheral vertigo and re-eval.   11:07  AM Patient is now feeling better after meclizine. She was able to walk to the bathroom with only minimal dizziness. Blood pressure is improved at 162/91 and just recently got her pressure medication.   Feel the patient is stable for discharge home. Will give a prescription for meclizine and gave her specific return precautions. She is to follow-up with her doctor next week if symptoms do not resolve.  Labs within normal limits.      Gwyneth Sprout, MD 01/30/16 1108

## 2016-01-30 NOTE — ED Notes (Signed)
Pt states this am around 0300 she was lying in bed and had a sudden onset of dizziness. This morning when she got out of bed pt states, "i felt like the whole room was spinning and became very nauseous and vomittied multiple times. Pt was still nauseous with ems and was given 8mg  of Zofran IV.

## 2016-02-17 ENCOUNTER — Other Ambulatory Visit: Payer: Self-pay | Admitting: Family Medicine

## 2016-02-17 ENCOUNTER — Ambulatory Visit (INDEPENDENT_AMBULATORY_CARE_PROVIDER_SITE_OTHER): Payer: Self-pay | Admitting: Family Medicine

## 2016-02-17 ENCOUNTER — Encounter: Payer: Self-pay | Admitting: Family Medicine

## 2016-02-17 VITALS — BP 135/71 | HR 94 | Temp 98.0°F | Ht 64.5 in | Wt 156.0 lb

## 2016-02-17 DIAGNOSIS — M545 Low back pain, unspecified: Secondary | ICD-10-CM

## 2016-02-17 DIAGNOSIS — K219 Gastro-esophageal reflux disease without esophagitis: Secondary | ICD-10-CM

## 2016-02-17 DIAGNOSIS — Z8739 Personal history of other diseases of the musculoskeletal system and connective tissue: Secondary | ICD-10-CM

## 2016-02-17 DIAGNOSIS — I10 Essential (primary) hypertension: Secondary | ICD-10-CM

## 2016-02-17 DIAGNOSIS — R739 Hyperglycemia, unspecified: Secondary | ICD-10-CM

## 2016-02-17 DIAGNOSIS — R42 Dizziness and giddiness: Secondary | ICD-10-CM

## 2016-02-17 LAB — POCT URINALYSIS DIP (DEVICE)
Bilirubin Urine: NEGATIVE
Glucose, UA: NEGATIVE mg/dL
HGB URINE DIPSTICK: NEGATIVE
Ketones, ur: NEGATIVE mg/dL
LEUKOCYTES UA: NEGATIVE
NITRITE: NEGATIVE
PH: 5 (ref 5.0–8.0)
Protein, ur: NEGATIVE mg/dL
SPECIFIC GRAVITY, URINE: 1.02 (ref 1.005–1.030)
UROBILINOGEN UA: 0.2 mg/dL (ref 0.0–1.0)

## 2016-02-17 LAB — TSH: TSH: 0.44 mIU/L

## 2016-02-17 MED ORDER — MECLIZINE HCL 25 MG PO TABS: 25.0000 mg | ORAL_TABLET | Freq: Three times a day (TID) | ORAL | Status: DC | PRN

## 2016-02-17 MED ORDER — MELOXICAM 15 MG PO TABS: 15.0000 mg | ORAL_TABLET | Freq: Every day | ORAL | Status: DC

## 2016-02-17 MED ORDER — ESOMEPRAZOLE MAGNESIUM 20 MG PO CPDR: 20.0000 mg | DELAYED_RELEASE_CAPSULE | Freq: Every day | ORAL | Status: DC

## 2016-02-17 MED ORDER — CLONIDINE HCL 0.1 MG PO TABS: 0.1000 mg | ORAL_TABLET | Freq: Two times a day (BID) | ORAL | Status: DC

## 2016-02-17 MED ORDER — HYDROCHLOROTHIAZIDE 25 MG PO TABS: 25.0000 mg | ORAL_TABLET | Freq: Every day | ORAL | Status: DC

## 2016-02-17 MED FILL — ?CLONIDINE HCL 0.1 MG TABL: 0.1 | 30 days supply | Qty: 60 | Fill #0

## 2016-02-17 MED FILL — MELOXICAM 15 MG TABLET: 15 | 30 days supply | Qty: 30 | Fill #0

## 2016-02-17 MED FILL — ?HYDROCHLOROTHIAZIDE 25 MG: 25 MG | 30 days supply | Qty: 30 | Fill #0

## 2016-02-17 NOTE — Progress Notes (Signed)
Subjective:    Patient ID: Nina Clark, female    DOB: 1966-03-06, 50 y.o.   MRN: [id number]  HPI Nina Clark, a 50 year old female with a history of hypertension and vertigo present to establish care. She states that she was a patient of Dr. Fleet Contras, but has not followed due to lack of payer source.  Patient here for follow-up of elevated blood pressure. She is not exercising and is not adherent to low salt diet. She does not check blood pressures at home. Patient denies chest pain, dyspnea, fatigue, irregular heart beat, lower extremity edema, palpitations, syncope and tachypnea.  Cardiovascular risk factors include: smoking/ tobacco exposure.   Patient presents with a history of dizziness. She is not currently experiencing symptoms. She states that she has symptoms periodically.  Patient denies aural pressure, otalgia, otorrhea, tinnitus, and hearing loss. She has been treated with Meclizine with satisfactory improvement.    Past Medical History  Diagnosis Date  . Hypertension     Social History   Social History  . Marital Status: Single    Spouse Name: N/A  . Number of Children: 1  . Years of Education: College   Occupational History  .  Other    [employer]   Social History Main Topics  . Smoking status: Current Every Day Smoker -- 0.40 packs/day for 25 years    Types: Cigarettes  . Smokeless tobacco: Never Used  . Alcohol Use: No  . Drug Use: Yes    Special: Marijuana     Comment: daily  . Sexual Activity: Yes    Birth Control/ Protection: None   Other Topics Concern  . Not on file   Social History Narrative   Patient lives with family.   Caffeine Use: none   Review of Systems  Constitutional: Negative.  Negative for fatigue.  HENT: Negative.   Eyes: Negative.   Respiratory: Negative.  Negative for shortness of breath, wheezing and stridor.   Cardiovascular: Negative.  Negative for chest pain, palpitations and leg swelling.  Gastrointestinal:  Negative.   Endocrine: Negative.  Negative for cold intolerance, heat intolerance, polydipsia, polyphagia and polyuria.  Genitourinary: Negative.   Musculoskeletal: Negative.   Skin: Negative.   Allergic/Immunologic: Negative.  Negative for immunocompromised state.  Neurological: Negative for dizziness (history of dizziness), facial asymmetry and weakness.  Hematological: Negative.   Psychiatric/Behavioral: Negative.        Objective:   Physical Exam  Constitutional: She is oriented to person, place, and time. She appears well-developed and well-nourished.  HENT:  Head: Normocephalic and atraumatic.  Right Ear: External ear normal.  Left Ear: External ear normal.  Nose: Nose normal.  Mouth/Throat: Oropharynx is clear and moist.  Eyes: Conjunctivae and EOM are normal. Pupils are equal, round, and reactive to light.  strabismus  Neck: Normal range of motion. Neck supple.  Cardiovascular: Normal rate, regular rhythm, normal heart sounds and intact distal pulses.   Pulmonary/Chest: Effort normal and breath sounds normal.  Abdominal: Soft. Bowel sounds are normal.  Musculoskeletal: Normal range of motion.  Neurological: She is alert and oriented to person, place, and time. She has normal reflexes.  Skin: Skin is warm and dry.  Psychiatric: She has a normal mood and affect. Her behavior is normal. Judgment and thought content normal.      BP 135/71 mmHg  Pulse 94  Temp(Src) 98 F (36.7 C) (Oral)  Ht 5' 4.5" (1.638 m)  Wt 156 lb (70.761 kg)  BMI 26.37  kg/m2  SpO2 100%  LMP 05/30/2015 Assessment & Plan:  1. Essential hypertension Blood pressure is at goal on current medication regimen. Will continue medications at previous dosages.  - Lipid Panel; Future - hydrochlorothiazide (HYDRODIURIL) 25 MG tablet; Take 1 tablet (25 mg total) by mouth daily.  Dispense: 30 tablet; Refill: 2 - cloNIDine (CATAPRES) 0.1 MG tablet; Take 1 tablet (0.1 mg total) by mouth 2 (two) times daily.   Dispense: 60 tablet; Refill: 2 - POCT urinalysis dipstick  2. Vertigo - Orthostatic vital signs - TSH - meclizine (ANTIVERT) 25 MG tablet; Take 1 tablet (25 mg total) by mouth 3 (three) times daily as needed for dizziness.  Dispense: 20 tablet; Refill: 0  3. History of arthritis - meloxicam (MOBIC) 15 MG tablet; Take 1 tablet (15 mg total) by mouth daily.  Dispense: 30 tablet; Refill: 2  4. Gastroesophageal reflux disease without esophagitis - esomeprazole (NEXIUM) 20 MG capsule; Take 1 capsule (20 mg total) by mouth daily.  Dispense: 30 capsule; Refill: 2  5. Hyperglycemia  - HgB A1c  6. Bilateral low back pain without sciatica - Vitamin D, 25-hydroxy     Routine Health Maintenance:  Recommend scheduling a pap smear Recommend routine screening mammogram   RTC: 3 months for hypertension; will follow-up by phone with laboratory results  Massie MaroonHollis,Christepher Melchior M, FNP

## 2016-02-18 MED FILL — TRAVEL SICKNESS 25 MG TAB C: 25 | 5 days supply | Qty: 20 | Fill #0

## 2016-02-18 NOTE — Patient Instructions (Signed)
Vertigo Vertigo means that you feel like you are moving when you are not. Vertigo can also make you feel like things around you are moving when they are not. This feeling can come and go at any time. Vertigo often goes away on its own. HOME CARE  Avoid making fast movements.  Avoid driving.  Avoid using heavy machinery.  Avoid doing any task or activity that might cause danger to you or other people if you would have a vertigo attack while you are doing it.  Sit down right away if you feel dizzy or have trouble with your balance.  Take over-the-counter and prescription medicines only as told by your doctor.  Follow instructions from your doctor about which positions or movements you should avoid.  Drink enough fluid to keep your pee (urine) clear or pale yellow.  Keep all follow-up visits as told by your doctor. This is important. GET HELP IF:  Medicine does not help your vertigo.  You have a fever.  Your problems get worse or you have new symptoms.  Your family or friends see changes in your behavior.  You feel sick to your stomach (nauseous) or you throw up (vomit).  You have a "pins and needles" feeling or you are numb in part of your body. GET HELP RIGHT AWAY IF:  You have trouble moving or talking.  You are always dizzy.  You pass out (faint).  You get very bad headaches.  You feel weak or have trouble using your hands, arms, or legs.  You have changes in your hearing.  You have changes in your seeing (vision).  You get a stiff neck.  Bright light starts to bother you.   This information is not intended to replace advice given to you by your health care provider. Make sure you discuss any questions you have with your health care provider.   Document Released: 07/28/2008 Document Revised: 07/10/2015 Document Reviewed: 02/11/2015 Elsevier Interactive Patient Education 2016 [employer] Inc.  

## 2016-02-19 LAB — VITAMIN D 25 HYDROXY (VIT D DEFICIENCY, FRACTURES): VIT D 25 HYDROXY: 10 ng/mL — AB (ref 30–100)

## 2016-02-21 ENCOUNTER — Telehealth: Payer: Self-pay

## 2016-02-21 ENCOUNTER — Other Ambulatory Visit: Payer: Self-pay | Admitting: Family Medicine

## 2016-02-21 DIAGNOSIS — E559 Vitamin D deficiency, unspecified: Secondary | ICD-10-CM | POA: Insufficient documentation

## 2016-02-21 MED ORDER — ERGOCALCIFEROL 1.25 MG (50000 UT) PO CAPS: 50000.0000 [IU] | ORAL_CAPSULE | ORAL | Status: DC

## 2016-02-21 MED FILL — VIT D2 1.25 MG (50,000 UNIT: 1.25 MG | 84 days supply | Qty: 12 | Fill #0

## 2016-02-21 NOTE — Telephone Encounter (Signed)
Called patient. Gave lab results and instructions. Patient verbalized understanding.   

## 2016-02-27 ENCOUNTER — Other Ambulatory Visit: Payer: Self-pay | Admitting: Family Medicine

## 2016-03-09 ENCOUNTER — Other Ambulatory Visit: Payer: Self-pay | Admitting: Family Medicine

## 2016-03-09 ENCOUNTER — Telehealth: Payer: Self-pay | Admitting: Hematology

## 2016-03-09 DIAGNOSIS — K219 Gastro-esophageal reflux disease without esophagitis: Secondary | ICD-10-CM

## 2016-03-09 MED ORDER — OMEPRAZOLE 20 MG PO CPDR: 20.0000 mg | DELAYED_RELEASE_CAPSULE | Freq: Every day | ORAL | Status: DC

## 2016-03-09 MED FILL — ?OMEPRAZOLE DR 20 MG CAPSUL: 20 | 30 days supply | Qty: 30 | Fill #0

## 2016-03-09 NOTE — Telephone Encounter (Signed)
Patient wants to know if she can have omeprazole instead of nexium because it is OTC.  Patient states she cant afford to pay the OTC price. / LOV 02/17/2016 /

## 2016-03-31 MED FILL — ?CLONIDINE HCL 0.1 MG TABL: 0.1 | 30 days supply | Qty: 60 | Fill #1

## 2016-03-31 MED FILL — MELOXICAM 15 MG TABLET: 15 | 30 days supply | Qty: 30 | Fill #1

## 2016-03-31 MED FILL — ?HYDROCHLOROTHIAZIDE 25 MG: 25 MG | 30 days supply | Qty: 30 | Fill #1

## 2016-04-16 MED FILL — ?OMEPRAZOLE DR 20 MG CAPSUL: 20 | 30 days supply | Qty: 30 | Fill #1

## 2016-04-28 MED FILL — MELOXICAM 15 MG TABLET: 15 | 30 days supply | Qty: 30 | Fill #2

## 2016-05-07 MED FILL — HYDROCHLOROTHIAZIDE 25 MG T: 25 | 30 days supply | Qty: 30 | Fill #2

## 2016-05-07 MED FILL — ?OMEPRAZOLE DR 20 MG CAPSUL: 20 | 30 days supply | Qty: 30 | Fill #2

## 2016-05-07 MED FILL — ?CLONIDINE HCL 0.1 MG TABL: 0.1 | 30 days supply | Qty: 60 | Fill #2

## 2016-05-07 MED FILL — VIT D2 1.25 MG (50,000 UNIT: 1.25 MG | 84 days supply | Qty: 12 | Fill #1

## 2016-05-18 ENCOUNTER — Ambulatory Visit (INDEPENDENT_AMBULATORY_CARE_PROVIDER_SITE_OTHER): Payer: Self-pay | Admitting: Family Medicine

## 2016-05-18 VITALS — BP 140/89 | HR 62 | Temp 97.8°F | Resp 16 | Ht 64.5 in | Wt 154.0 lb

## 2016-05-18 DIAGNOSIS — I10 Essential (primary) hypertension: Secondary | ICD-10-CM

## 2016-05-18 DIAGNOSIS — E559 Vitamin D deficiency, unspecified: Secondary | ICD-10-CM

## 2016-05-18 DIAGNOSIS — K219 Gastro-esophageal reflux disease without esophagitis: Secondary | ICD-10-CM

## 2016-05-18 DIAGNOSIS — R42 Dizziness and giddiness: Secondary | ICD-10-CM

## 2016-05-18 DIAGNOSIS — Z23 Encounter for immunization: Secondary | ICD-10-CM

## 2016-05-18 DIAGNOSIS — Z8739 Personal history of other diseases of the musculoskeletal system and connective tissue: Secondary | ICD-10-CM

## 2016-05-18 LAB — TSH: TSH: 0.54 m[IU]/L

## 2016-05-18 MED ORDER — OMEPRAZOLE 40 MG PO CPDR: 40.0000 mg | DELAYED_RELEASE_CAPSULE | Freq: Every day | ORAL | Status: DC

## 2016-05-18 MED ORDER — MECLIZINE HCL 25 MG PO TABS: 25.0000 mg | ORAL_TABLET | Freq: Three times a day (TID) | ORAL | Status: DC | PRN

## 2016-05-18 MED ORDER — ERGOCALCIFEROL 1.25 MG (50000 UT) PO CAPS: 50000.0000 [IU] | ORAL_CAPSULE | ORAL | Status: DC

## 2016-05-18 MED ORDER — CLONIDINE HCL 0.1 MG PO TABS: 0.1000 mg | ORAL_TABLET | Freq: Two times a day (BID) | ORAL | Status: DC

## 2016-05-18 MED ORDER — HYDROCHLOROTHIAZIDE 25 MG PO TABS: 25.0000 mg | ORAL_TABLET | Freq: Every day | ORAL | Status: DC

## 2016-05-18 MED ORDER — MELOXICAM 15 MG PO TABS: 15.0000 mg | ORAL_TABLET | Freq: Every day | ORAL | Status: DC

## 2016-05-18 NOTE — Patient Instructions (Signed)
Have increased Prilosec for GERD to 40 mg daily.

## 2016-05-19 LAB — LIPID PANEL
Cholesterol: 211 mg/dL — ABNORMAL HIGH (ref 125–200)
HDL: 100 mg/dL (ref >=46)
LDL Cholesterol: 98 mg/dL (ref <130)
TRIGLYCERIDES: 67 mg/dL (ref <150)
Total CHOL/HDL Ratio: 2.1 Ratio (ref <=5.0)
VLDL: 13 mg/dL (ref <30)

## 2016-05-19 NOTE — Progress Notes (Signed)
Patient ID: Nina Clark, female   DOB: 04/18/1966, 50 y.o.   MRN: [id number]   Nina Clark, is a 50 y.o. female  [id number]  [id number]  DOB - 12/03/1965  CC:  Chief Complaint  Patient presents with  . Hypertension       HPI: Nina Clark is a 50 y.o. female to follow-up on chronic conditions. She has a history of hypertension. She is on clonidine 0.1 bid, hctz 25 daily. She is also on meclizine for occasional vertigo and Vitamin D for deficiency  She also takes omeprazole for GERD and meloxicam for leg pain. . She offers no spontaneous complaints today. She reports her PAP and mammogram are UTD. She reports following a low salt diet and walks stairs numerous times a day at work.   No Known Allergies Past Medical History  Diagnosis Date  . Hypertension    Current Outpatient Prescriptions on File Prior to Visit  Medication Sig Dispense Refill  . ondansetron (ZOFRAN) 4 MG tablet Take 1 tablet (4 mg total) by mouth every 6 (six) hours. 12 tablet 0  . diphenhydrAMINE (BENADRYL) 25 MG tablet Take 1 tablet (25 mg total) by mouth every 6 (six) hours. (Patient not taking: Reported on 05/18/2016) 20 tablet 0   No current facility-administered medications on file prior to visit.   Family History  Problem Relation Age of Onset  . Hypertension Mother   . Hypertension Father   . Diabetes Father   . Diabetes Sister    Social History   Social History  . Marital Status: Single    Spouse Name: N/A  . Number of Children: 1  . Years of Education: College   Occupational History  .  Other    [employer]   Social History Main Topics  . Smoking status: Current Every Day Smoker -- 0.40 packs/day for 25 years    Types: Cigarettes  . Smokeless tobacco: Never Used  . Alcohol Use: No  . Drug Use: Yes    Special: Marijuana     Comment: daily  . Sexual Activity: Yes    Birth Control/ Protection: None   Other Topics Concern  . Not on file   Social History Narrative   Patient lives  with family.   Caffeine Use: none    Review of Systems: Constitutional: Negative for fever, chills, appetite change, weight loss,  Fatigue. Skin: Negative for rashes or lesions of concern. HENT: Negative for ear pain, ear discharge.nose bleeds Eyes: Negative for pain, discharge, redness, itching and visual disturbance. Neck: Negative for pain, stiffness Respiratory: Negative for cough, shortness of breath,   Cardiovascular: Negative for chest pain, palpitations and leg swelling. Gastrointestinal: Negative for abdominal pain, nausea, vomiting, diarrhea, constipations. Positive for heartburn Genitourinary: Negative for dysuria, urgency, frequency, hematuria,  Musculoskeletal: Negative for back pain, joint pain, joint  swelling, and gait problem.Negative for weakness. Positive for left leg pain Neurological: Negative for dizziness, tremors, seizures, syncope,   light-headedness, numbness and headaches.  Hematological: Negative for easy bruising or bleeding Psychiatric/Behavioral: Negative for depression, anxiety, decreased concentration, confusion   Objective:   Filed Vitals:   05/18/16 1336 05/18/16 1411  BP: 157/80 140/89  Pulse: 62   Temp: 97.8 F (36.6 C)   Resp: 16     Physical Exam: Constitutional: Patient appears well-developed and well-nourished. No distress. HENT: Normocephalic, atraumatic, External right and left ear normal. Oropharynx is clear and moist.  Eyes: Conjunctivae and EOM are normal. PERRLA, no scleral icterus. Neck: Normal ROM. Neck supple.  No lymphadenopathy, No thyromegaly. CVS: RRR, S1/S2 +, no murmurs, no gallops, no rubs Pulmonary: Effort and breath sounds normal, no stridor, rhonchi, wheezes, rales.  Abdominal: Soft. Normoactive BS,, no distension, tenderness, rebound or guarding.  Musculoskeletal: Normal range of motion. No edema and no tenderness.  Neuro: Alert.Normal muscle tone coordination. Non-focal Skin: Skin is warm and dry. No rash noted. Not  diaphoretic. No erythema. No pallor. Psychiatric: Normal mood and affect. Behavior, judgment, thought content normal.  Lab Results  Component Value Date   WBC 5.0 01/30/2016   HGB 12.8 01/30/2016   HCT 37.0 01/30/2016   MCV 88.7 01/30/2016   PLT 285 01/30/2016   Lab Results  Component Value Date   CREATININE 0.86 01/30/2016   BUN 13 01/30/2016   NA 138 01/30/2016   K 4.1 01/30/2016   CL 103 01/30/2016   CO2 25 01/30/2016    No results found for: HGBA1C Lipid Panel     Component Value Date/Time   CHOL 211* 05/18/2016 1425   TRIG 67 05/18/2016 1425   HDL 100 05/18/2016 1425   CHOLHDL 2.1 05/18/2016 1425   VLDL 13 05/18/2016 1425   LDLCALC 98 05/18/2016 1425       Assessment and plan:   1. Gastroesophageal reflux disease without esophagitis  - omeprazole (PRILOSEC) 40 MG capsule; Take 1 capsule (40 mg total) by mouth daily.  Dispense: 30 capsule; Refill: 3  2. Essential hypertension - Lipid panel - TSH - cloNIDine (CATAPRES) 0.1 MG tablet; Take 1 tablet (0.1 mg total) by mouth 2 (two) times daily.  Dispense: 60 tablet; Refill: 2 - hydrochlorothiazide (HYDRODIURIL) 25 MG tablet; Take 1 tablet (25 mg total) by mouth daily.  Dispense: 30 tablet; Refill: 2  3. History of arthritis - meloxicam (MOBIC) 15 MG tablet; Take 1 tablet (15 mg total) by mouth daily.  Dispense: 30 tablet; Refill: 2  4. Vertigo - meclizine (ANTIVERT) 25 MG tablet; Take 1 tablet (25 mg total) by mouth 3 (three) times daily as needed for dizziness.  Dispense: 20 tablet; Refill: 0  5. Vitamin D deficiency - ergocalciferol (DRISDOL) 50000 units capsule; Take 1 capsule (50,000 Units total) by mouth once a week.  Dispense: 30 capsule; Refill: 0  6. Need for Tdap vaccination - Tdap given today.   Return in about 6 months (around 11/18/2016) for HTN.  The patient was given clear instructions to go to ER or return to medical center if symptoms don't improve, worsen or new problems develop. The  patient verbalized understanding.    Henrietta Hoover FNP  05/19/2016, 1:23 PM

## 2016-05-28 MED FILL — MELOXICAM 15 MG TABLET: 15 | 30 days supply | Qty: 30 | Fill #0

## 2016-05-28 MED FILL — OMEPRAZOLE DR 40 MG CAPSULE: 40 | 30 days supply | Qty: 30 | Fill #0

## 2016-06-09 MED FILL — ?CLONIDINE HCL 0.1 MG TABL: 0.1 | 30 days supply | Qty: 60 | Fill #0

## 2016-06-09 MED FILL — HYDROCHLOROTHIAZIDE 25 MG T: 25 | 30 days supply | Qty: 30 | Fill #0

## 2016-06-23 MED FILL — MELOXICAM 15 MG TABLET: 15 | 30 days supply | Qty: 30 | Fill #1

## 2016-06-23 MED FILL — OMEPRAZOLE DR 40 MG CAPSULE: 40 | 30 days supply | Qty: 30 | Fill #1

## 2016-07-03 MED FILL — cloNIDine HCL 0.1 MG TABS: 0.1 | 30 days supply | Qty: 60 | Fill #1

## 2016-07-03 MED FILL — HYDROCHLOROTHIAZIDE 25 MG T: 25 | 30 days supply | Qty: 30 | Fill #1

## 2016-07-15 MED FILL — MELOXICAM 15 MG TABLET: 15 | 30 days supply | Qty: 30 | Fill #2

## 2016-07-15 MED FILL — OMEPRAZOLE DR 40 MG CAPSULE: 40 | 30 days supply | Qty: 30 | Fill #2

## 2016-08-07 ENCOUNTER — Other Ambulatory Visit: Payer: Self-pay | Admitting: Family Medicine

## 2016-08-07 DIAGNOSIS — Z8739 Personal history of other diseases of the musculoskeletal system and connective tissue: Secondary | ICD-10-CM

## 2016-08-07 MED FILL — cloNIDine HCL 0.1 MG TABS: 0.1 | 30 days supply | Qty: 60 | Fill #2

## 2016-08-07 MED FILL — HYDROCHLOROTHIAZIDE 25 MG T: 25 | 30 days supply | Qty: 30 | Fill #2

## 2016-08-07 MED FILL — OMEPRAZOLE DR 40 MG CAPSULE: 40 | 30 days supply | Qty: 30 | Fill #3

## 2016-08-07 MED FILL — MELOXICAM 15 MG TABLET: 15 | 30 days supply | Qty: 30 | Fill #0

## 2016-08-17 MED FILL — VIT D2 1.25 MG (50,000 UNIT: 1.25 MG | 84 days supply | Qty: 12 | Fill #0

## 2016-09-08 ENCOUNTER — Telehealth: Payer: Self-pay

## 2016-09-08 ENCOUNTER — Other Ambulatory Visit: Payer: Self-pay | Admitting: Family Medicine

## 2016-09-08 DIAGNOSIS — K219 Gastro-esophageal reflux disease without esophagitis: Secondary | ICD-10-CM

## 2016-09-08 DIAGNOSIS — I10 Essential (primary) hypertension: Secondary | ICD-10-CM

## 2016-09-08 MED FILL — cloNIDine HCL 0.1 MG TABS: 0.1 | 30 days supply | Qty: 60 | Fill #0

## 2016-09-08 MED FILL — HYDROCHLOROTHIAZIDE 25 MG T: 25 | 30 days supply | Qty: 30 | Fill #0

## 2016-09-08 MED FILL — OMEPRAZOLE DR 40 MG CAPSULE: 40 | 30 days supply | Qty: 30 | Fill #0

## 2016-09-08 MED FILL — MELOXICAM 15 MG TABLET: 15 | 30 days supply | Qty: 30 | Fill #1

## 2016-10-06 MED FILL — MELOXICAM 15 MG TABLET: 15 | 30 days supply | Qty: 30 | Fill #2

## 2016-10-06 MED FILL — HYDROCHLOROTHIAZIDE 25 MG T: 25 | 30 days supply | Qty: 30 | Fill #1

## 2016-10-06 MED FILL — cloNIDine HCL 0.1 MG TABS: 0.1 | 30 days supply | Qty: 60 | Fill #1

## 2016-10-06 MED FILL — OMEPRAZOLE DR 40 MG CAPSULE: 40 | 30 days supply | Qty: 30 | Fill #1

## 2016-11-03 MED FILL — HYDROCHLOROTHIAZIDE 25 MG T: 25 | 30 days supply | Qty: 30 | Fill #2

## 2016-11-03 MED FILL — OMEPRAZOLE DR 40 MG CAPSULE: 40 | 30 days supply | Qty: 30 | Fill #2

## 2016-11-03 MED FILL — MELOXICAM 15 MG TABLET: 15 | 30 days supply | Qty: 30 | Fill #3

## 2016-11-03 MED FILL — cloNIDine HCL 0.1 MG TABS: 0.1 | 30 days supply | Qty: 60 | Fill #2

## 2016-11-16 ENCOUNTER — Ambulatory Visit: Payer: Self-pay | Admitting: Family Medicine

## 2016-11-18 ENCOUNTER — Ambulatory Visit: Payer: Self-pay | Admitting: Family Medicine

## 2016-12-01 ENCOUNTER — Encounter: Payer: Self-pay | Admitting: Family Medicine

## 2016-12-01 ENCOUNTER — Ambulatory Visit (INDEPENDENT_AMBULATORY_CARE_PROVIDER_SITE_OTHER): Payer: Self-pay | Admitting: Family Medicine

## 2016-12-01 DIAGNOSIS — I10 Essential (primary) hypertension: Secondary | ICD-10-CM

## 2016-12-01 DIAGNOSIS — K219 Gastro-esophageal reflux disease without esophagitis: Secondary | ICD-10-CM

## 2016-12-01 DIAGNOSIS — E559 Vitamin D deficiency, unspecified: Secondary | ICD-10-CM

## 2016-12-01 DIAGNOSIS — Z8739 Personal history of other diseases of the musculoskeletal system and connective tissue: Secondary | ICD-10-CM

## 2016-12-01 LAB — LIPID PANEL
CHOL/HDL RATIO: 2.5 ratio (ref <5.0)
CHOLESTEROL: 217 mg/dL — AB (ref <200)
HDL: 88 mg/dL (ref >50)
LDL Cholesterol: 114 mg/dL — ABNORMAL HIGH (ref <100)
Triglycerides: 73 mg/dL (ref <150)
VLDL: 15 mg/dL (ref <30)

## 2016-12-01 LAB — BASIC METABOLIC PANEL
BUN: 15 mg/dL (ref 7–25)
CHLORIDE: 101 mmol/L (ref 98–110)
CO2: 30 mmol/L (ref 20–31)
Calcium: 9.8 mg/dL (ref 8.6–10.4)
Creat: 0.86 mg/dL (ref 0.50–1.05)
Glucose, Bld: 102 mg/dL — ABNORMAL HIGH (ref 65–99)
POTASSIUM: 4 mmol/L (ref 3.5–5.3)
SODIUM: 137 mmol/L (ref 135–146)

## 2016-12-01 LAB — POCT URINALYSIS DIP (DEVICE)
Bilirubin Urine: NEGATIVE
Glucose, UA: NEGATIVE mg/dL
HGB URINE DIPSTICK: NEGATIVE
KETONES UR: NEGATIVE mg/dL
Leukocytes, UA: NEGATIVE
Nitrite: NEGATIVE
PH: 6 (ref 5.0–8.0)
Protein, ur: NEGATIVE mg/dL
SPECIFIC GRAVITY, URINE: 1.02 (ref 1.005–1.030)
Urobilinogen, UA: 0.2 mg/dL (ref 0.0–1.0)

## 2016-12-01 MED ORDER — HYDROCHLOROTHIAZIDE 25 MG PO TABS: 25.0000 mg | ORAL_TABLET | Freq: Every day | ORAL | 5 refills | Status: DC

## 2016-12-01 MED ORDER — OMEPRAZOLE 40 MG PO CPDR: 40.0000 mg | DELAYED_RELEASE_CAPSULE | Freq: Every day | ORAL | 5 refills | Status: DC

## 2016-12-01 MED ORDER — CLONIDINE HCL 0.1 MG PO TABS
0.1000 mg | ORAL_TABLET | Freq: Two times a day (BID) | ORAL | 2 refills | Status: DC
Start: 2016-12-01 — End: 2017-03-05

## 2016-12-01 MED ORDER — MELOXICAM 15 MG PO TABS: 15.0000 mg | ORAL_TABLET | Freq: Every day | ORAL | 5 refills | Status: DC

## 2016-12-01 MED ORDER — ERGOCALCIFEROL 1.25 MG (50000 UT) PO CAPS: 50000.0000 [IU] | ORAL_CAPSULE | ORAL | 0 refills | Status: DC

## 2016-12-01 MED FILL — cloNIDine HCL 0.1 MG TABS: 0.1 | 30 days supply | Qty: 60 | Fill #0

## 2016-12-01 MED FILL — HYDROCHLOROTHIAZIDE 25 MG T: 25 | 30 days supply | Qty: 30 | Fill #0

## 2016-12-01 MED FILL — OMEPRAZOLE DR 40 MG CAPSULE: 40 | 30 days supply | Qty: 30 | Fill #0

## 2016-12-01 MED FILL — ?MELOXICAM 15 MG TABLET: 15 | 30 days supply | Qty: 30 | Fill #0

## 2016-12-01 MED FILL — VIT D2 1.25 MG (50,000 UNIT: 1.25 MG | 28 days supply | Qty: 4 | Fill #0

## 2016-12-01 NOTE — Progress Notes (Signed)
Subjective:    Patient ID: Nina Clark, female    DOB: Mar 16, 1966, 51 y.o.   MRN: [id number]  HPI Nina Clark, a 51 year old female with a history of hypertension and GERD presents for a follow up of chronic conditions. She says that she is taking medications consistently.  She is not exercising and is not adherent to low salt diet. She does not check blood pressures at home. Patient denies chest pain, dyspnea, fatigue, irregular heart beat, lower extremity edema, palpitations, syncope and tachypnea.  Cardiovascular risk factors include: smoking/ tobacco exposure.   Paitent complains of GERD. Symptoms are controlled on Omeprazole 40 mg daily. Symptoms have been associated with fullness after meals and heartburn.  She denies abdominal bloating, belching and eructation, chest pain, choking on food, cough, hematemesis, hoarseness and laryngitis.  She denies dysphagia.  She has not lost weight. She denies melena, hematochezia, hematemesis, and coffee ground emesis.  Past Medical History:  Diagnosis Date  . Hypertension     Social History   Social History  . Marital status: Single    Spouse name: N/A  . Number of children: 1  . Years of education: College   Occupational History  .  Other    [employer]   Social History Main Topics  . Smoking status: Current Every Day Smoker    Packs/day: 0.25    Years: 25.00    Types: Cigarettes  . Smokeless tobacco: Never Used  . Alcohol use No  . Drug use: Yes    Types: Marijuana     Comment: daily  . Sexual activity: Yes    Birth control/ protection: None   Other Topics Concern  . Not on file   Social History Narrative   Patient lives with family.   Caffeine Use: none   Review of Systems  Constitutional: Negative.  Negative for fatigue.  HENT: Negative.   Eyes: Negative.   Respiratory: Negative.  Negative for shortness of breath, wheezing and stridor.   Cardiovascular: Negative.  Negative for chest pain, palpitations and leg  swelling.  Gastrointestinal: Negative.   Endocrine: Negative.  Negative for cold intolerance, heat intolerance, polydipsia, polyphagia and polyuria.  Genitourinary: Negative.   Musculoskeletal: Negative.   Skin: Negative.   Allergic/Immunologic: Negative.  Negative for immunocompromised state.  Neurological: Negative for dizziness (history of dizziness), facial asymmetry and weakness.  Hematological: Negative.   Psychiatric/Behavioral: Negative.        Objective:   Physical Exam  Constitutional: She is oriented to person, place, and time. She appears well-developed and well-nourished.  HENT:  Head: Normocephalic and atraumatic.  Right Ear: External ear normal.  Left Ear: External ear normal.  Nose: Nose normal.  Mouth/Throat: Oropharynx is clear and moist.  Eyes: Conjunctivae and EOM are normal. Pupils are equal, round, and reactive to light.  strabismus  Neck: Normal range of motion. Neck supple.  Cardiovascular: Normal rate, regular rhythm, normal heart sounds and intact distal pulses.   Pulmonary/Chest: Effort normal and breath sounds normal.  Abdominal: Soft. Bowel sounds are normal.  Musculoskeletal: Normal range of motion.  Neurological: She is alert and oriented to person, place, and time. She has normal reflexes.  Skin: Skin is warm and dry.  Psychiatric: She has a normal mood and affect. Her behavior is normal. Judgment and thought content normal.      BP 126/86 (BP Location: Right Arm, Patient Position: Sitting, Cuff Size: Normal)   Pulse 84   Temp 98 F (36.7 C)  Resp 18   Ht 5' 4.5" (1.638 m)   Wt 165 lb 6.4 oz (75 kg)   SpO2 100%   BMI 27.95 kg/m  Assessment & Plan:  1. Gastroesophageal reflux disease without esophagitis The patient is asked to make an attempt to improve diet and exercise patterns to aid in medical management of this problem. - omeprazole (PRILOSEC) 40 MG capsule; Take 1 capsule (40 mg total) by mouth daily.  Dispense: 30 capsule; Refill:  5  2. History of arthritis - meloxicam (MOBIC) 15 MG tablet; Take 1 tablet (15 mg total) by mouth daily.  Dispense: 30 tablet; Refill: 5  3. Essential hypertension - hydrochlorothiazide (HYDRODIURIL) 25 MG tablet; Take 1 tablet (25 mg total) by mouth daily.  Dispense: 30 tablet; Refill: 5 - cloNIDine (CATAPRES) 0.1 MG tablet; Take 1 tablet (0.1 mg total) by mouth 2 (two) times daily.  Dispense: 60 tablet; Refill: 2 - Lipid Panel - Basic Metabolic Panel  4. Vitamin D deficiency - ergocalciferol (DRISDOL) 50000 units capsule; Take 1 capsule (50,000 Units total) by mouth once a week.  Dispense: 30 capsule; Refill: 0   Routine Health Maintenance:  Recommend scheduling a pap smear Recommend routine screening mammogram   RTC: 6 months for hypertension; will follow-up by phone with laboratory results  Hollis,Lachina M, FNP      The patient was given clear instructions to go to ER or return to medical center if symptoms do not improve, worsen or new problems develop. The patient verbalized understanding. Will notify patient with laboratory results.

## 2016-12-01 NOTE — Patient Instructions (Addendum)
Food Choices for Gastroesophageal Reflux Disease, Adult When you have gastroesophageal reflux disease (GERD), the foods you eat and your eating habits are very important. Choosing the right foods can help ease your discomfort. What guidelines do I need to follow?  Choose fruits, vegetables, whole grains, and low-fat dairy products.  Choose low-fat meat, fish, and poultry.  Limit fats such as oils, salad dressings, butter, nuts, and avocado.  Keep a food diary. This helps you identify foods that cause symptoms.  Avoid foods that cause symptoms. These may be different for everyone.  Eat small meals often instead of 3 large meals a day.  Eat your meals slowly, in a place where you are relaxed.  Limit fried foods.  Cook foods using methods other than frying.  Avoid drinking alcohol.  Avoid drinking large amounts of liquids with your meals.  Avoid bending over or lying down until 2-3 hours after eating. What foods are not recommended? These are some foods and drinks that may make your symptoms worse: Vegetables  Tomatoes. Tomato juice. Tomato and spaghetti sauce. Chili peppers. Onion and garlic. Horseradish. Fruits  Oranges, grapefruit, and lemon (fruit and juice). Meats  High-fat meats, fish, and poultry. This includes hot dogs, ribs, ham, sausage, salami, and bacon. Dairy  Whole milk and chocolate milk. Sour cream. Cream. Butter. Ice cream. Cream cheese. Drinks  Coffee and tea. Bubbly (carbonated) drinks or energy drinks. Condiments  Hot sauce. Barbecue sauce. Sweets/Desserts  Chocolate and cocoa. Donuts. Peppermint and spearmint. Fats and Oils  High-fat foods. This includes [place] fries and potato chips. Other  Vinegar. Strong spices. This includes black pepper, white pepper, red pepper, cayenne, curry powder, cloves, ginger, and chili powder. The items listed above may not be a complete list of foods and drinks to avoid. Contact your dietitian for more information.    This information is not intended to replace advice given to you by your health care provider. Make sure you discuss any questions you have with your health care provider. Document Released: 04/19/2012 Document Revised: 03/26/2016 Document Reviewed: 08/23/2013 Elsevier Interactive Patient Education  2017 Elsevier Inc.  Vitamin D Deficiency Introduction Vitamin D deficiency is when your body does not have enough vitamin D. Vitamin D is important because:  It helps your body use other minerals that your body needs.  It helps keep your bones strong and healthy.  It may help to prevent some diseases.  It helps your heart and other muscles work well. You can get vitamin D by:  Eating foods with vitamin D in them.  Drinking or eating milk or other foods that have had vitamin D added to them.  Taking a vitamin D supplement.  Being in the sun. Not getting enough vitamin D can make your bones become soft. It can also cause other health problems. Follow these instructions at home:  Take medicines and supplements only as told by your doctor.  Eat foods that have vitamin D. These include:  Dairy products, cereals, or juices with added vitamin D. Check the label for vitamin D.  Fatty fish like salmon or trout.  Eggs.  Oysters.  Do not use tanning beds.  Stay at a healthy weight. Lose weight, if needed.  Keep all follow-up visits as told by your doctor. This is important. Contact a doctor if:  Your symptoms do not go away.  You feel sick to your stomach (nauseous).  Youthrow up (vomit).  You poop less often than usual or you have trouble pooping (constipation). This  information is not intended to replace advice given to you by your health care provider. Make sure you discuss any questions you have with your health care provider. Document Released: 10/08/2011 Document Revised: 03/26/2016 Document Reviewed: 03/06/2015  2017 Elsevier

## 2017-01-04 MED FILL — HYDROCHLOROTHIAZIDE 25 MG T: 25 | 30 days supply | Qty: 30 | Fill #1

## 2017-01-04 MED FILL — VIT D2 1.25 MG (50,000 UNIT: 1.25 MG | 28 days supply | Qty: 4 | Fill #1

## 2017-01-04 MED FILL — MELOXICAM 15 MG TABLET: 15 | 30 days supply | Qty: 30 | Fill #1

## 2017-01-04 MED FILL — OMEPRAZOLE DR 40 MG CAPSULE: 40 | 30 days supply | Qty: 30 | Fill #1

## 2017-01-05 MED FILL — cloNIDine HCL 0.1 MG TABS: 0.1 | 30 days supply | Qty: 60 | Fill #1

## 2017-02-02 MED FILL — cloNIDine HCL 0.1 MG TABS: 0.1 | 30 days supply | Qty: 60 | Fill #2

## 2017-02-02 MED FILL — HYDROCHLOROTHIAZIDE 25 MG T: 25 | 30 days supply | Qty: 30 | Fill #2

## 2017-02-02 MED FILL — VIT D2 1.25 MG (50,000 UNIT: 1.25 MG | 28 days supply | Qty: 4 | Fill #2

## 2017-02-02 MED FILL — OMEPRAZOLE DR 40 MG CAPSULE: 40 | 30 days supply | Qty: 30 | Fill #2

## 2017-02-02 MED FILL — MELOXICAM 15 MG TABLET: 15 | 30 days supply | Qty: 30 | Fill #2

## 2017-03-05 ENCOUNTER — Other Ambulatory Visit: Payer: Self-pay | Admitting: Family Medicine

## 2017-03-05 DIAGNOSIS — I10 Essential (primary) hypertension: Secondary | ICD-10-CM

## 2017-03-05 MED FILL — VIT D2 1.25 MG (50,000 UNIT: 1.25 MG | 28 days supply | Qty: 4 | Fill #3

## 2017-03-05 MED FILL — ?MELOXICAM 15 MG TAB: 15 MG | 30 days supply | Qty: 30 | Fill #3

## 2017-03-05 MED FILL — HYDROCHLOROTHIAZIDE 25 MG T: 25 | 30 days supply | Qty: 30 | Fill #3

## 2017-03-05 MED FILL — cloNIDine HCL 0.1 MG TABS: 0.1 | 30 days supply | Qty: 60 | Fill #0

## 2017-03-05 MED FILL — OMEPRAZOLE DR 40 MG CAPSULE: 40 | 30 days supply | Qty: 30 | Fill #3

## 2017-04-02 MED FILL — ?MELOXICAM 15 MG TAB: 15 MG | 30 days supply | Qty: 30 | Fill #4

## 2017-04-02 MED FILL — AMOXICILLIN 875 MG TABLET: 875 | 7 days supply | Qty: 14 | Fill #0

## 2017-04-02 MED FILL — HYDROCHLOROTHIAZIDE 25 MG T: 25 | 30 days supply | Qty: 30 | Fill #4

## 2017-04-02 MED FILL — ?CLONIDINE HCL 0.1 MG TABL: 0.1 | 30 days supply | Qty: 60 | Fill #1

## 2017-04-02 MED FILL — OMEPRAZOLE DR 40 MG CAPSULE: 40 | 30 days supply | Qty: 30 | Fill #4

## 2017-04-02 MED FILL — VIT D2 1.25 MG (50,000 UNIT: 1.25 MG | 28 days supply | Qty: 4 | Fill #4

## 2017-05-07 ENCOUNTER — Other Ambulatory Visit: Payer: Self-pay | Admitting: Family Medicine

## 2017-05-07 DIAGNOSIS — K219 Gastro-esophageal reflux disease without esophagitis: Secondary | ICD-10-CM

## 2017-05-07 DIAGNOSIS — I10 Essential (primary) hypertension: Secondary | ICD-10-CM

## 2017-05-07 MED FILL — HYDROCHLOROTHIAZIDE 25 MG T: 25 | 30 days supply | Qty: 30 | Fill #5

## 2017-05-07 MED FILL — OMEPRAZOLE DR 40 MG CAPSULE: 40 | 30 days supply | Qty: 30 | Fill #5

## 2017-05-07 MED FILL — ?MELOXICAM 15MG TABLET: 15 | 30 days supply | Qty: 30 | Fill #5

## 2017-05-07 MED FILL — VIT D2 1.25 MG (50,000 UNIT: 1.25 MG | 28 days supply | Qty: 4 | Fill #5

## 2017-05-07 MED FILL — ?CLONIDINE HCL 0.1 MG TABL: 0.1 | 30 days supply | Qty: 60 | Fill #2

## 2017-05-31 ENCOUNTER — Encounter: Payer: Self-pay | Admitting: Family Medicine

## 2017-05-31 ENCOUNTER — Ambulatory Visit (INDEPENDENT_AMBULATORY_CARE_PROVIDER_SITE_OTHER): Payer: Self-pay | Admitting: Family Medicine

## 2017-05-31 ENCOUNTER — Other Ambulatory Visit (HOSPITAL_COMMUNITY)
Admission: RE | Admit: 2017-05-31 | Discharge: 2017-05-31 | Disposition: A | Discharge status: Home / Self Care | Payer: Self-pay | Source: Ambulatory Visit | Attending: Family Medicine | Admitting: Family Medicine

## 2017-05-31 VITALS — BP 122/83 | HR 84 | Temp 98.2°F | Resp 16 | Ht 64.5 in | Wt 160.0 lb

## 2017-05-31 DIAGNOSIS — K219 Gastro-esophageal reflux disease without esophagitis: Secondary | ICD-10-CM

## 2017-05-31 DIAGNOSIS — E559 Vitamin D deficiency, unspecified: Secondary | ICD-10-CM

## 2017-05-31 DIAGNOSIS — I10 Essential (primary) hypertension: Secondary | ICD-10-CM

## 2017-05-31 DIAGNOSIS — Z01419 Encounter for gynecological examination (general) (routine) without abnormal findings: Secondary | ICD-10-CM

## 2017-05-31 DIAGNOSIS — F17200 Nicotine dependence, unspecified, uncomplicated: Secondary | ICD-10-CM

## 2017-05-31 LAB — POCT URINALYSIS DIP (DEVICE)
BILIRUBIN URINE: NEGATIVE
GLUCOSE, UA: NEGATIVE mg/dL
HGB URINE DIPSTICK: NEGATIVE
LEUKOCYTES UA: NEGATIVE
NITRITE: NEGATIVE
Protein, ur: 100 mg/dL — AB
SPECIFIC GRAVITY, URINE: 1.025 (ref 1.005–1.030)
Urobilinogen, UA: 0.2 mg/dL (ref 0.0–1.0)
pH: 6 (ref 5.0–8.0)

## 2017-05-31 LAB — BASIC METABOLIC PANEL WITH GFR
BUN: 15 mg/dL (ref 7–25)
CHLORIDE: 97 mmol/L — AB (ref 98–110)
CO2: 29 mmol/L (ref 20–31)
Calcium: 9.7 mg/dL (ref 8.6–10.4)
Creat: 0.91 mg/dL (ref 0.50–1.05)
GFR, EST NON AFRICAN AMERICAN: 72 mL/min (ref >=60)
GFR, Est African American: 83 mL/min (ref >=60)
Glucose, Bld: 82 mg/dL (ref 65–99)
Potassium: 3.4 mmol/L — ABNORMAL LOW (ref 3.5–5.3)
SODIUM: 137 mmol/L (ref 135–146)

## 2017-05-31 MED ORDER — CLONIDINE HCL 0.1 MG PO TABS: 0.1000 mg | ORAL_TABLET | Freq: Two times a day (BID) | ORAL | 5 refills | Status: DC

## 2017-05-31 MED ORDER — ERGOCALCIFEROL 1.25 MG (50000 UT) PO CAPS: 50000.0000 [IU] | ORAL_CAPSULE | ORAL | 0 refills | Status: DC

## 2017-05-31 MED ORDER — OMEPRAZOLE 40 MG PO CPDR: 40.0000 mg | DELAYED_RELEASE_CAPSULE | Freq: Every day | ORAL | 5 refills | Status: DC

## 2017-05-31 MED ORDER — HYDROCHLOROTHIAZIDE 25 MG PO TABS: 25.0000 mg | ORAL_TABLET | Freq: Every day | ORAL | 5 refills | Status: DC

## 2017-05-31 MED FILL — VIT D2 1.25 MG (50,000 UNIT: 1.25 MG | 84 days supply | Qty: 12 | Fill #0

## 2017-05-31 MED FILL — HYDROCHLOROTHIAZIDE 25 MG T: 25 | 30 days supply | Qty: 30 | Fill #0

## 2017-05-31 MED FILL — OMEPRAZOLE DR 40 MG CAPSULE: 40 | 30 days supply | Qty: 30 | Fill #0

## 2017-05-31 MED FILL — cloNIDine HCL 0.1 MG TABS: 0.1 | 30 days supply | Qty: 60 | Fill #0

## 2017-05-31 NOTE — Progress Notes (Signed)
Subjective:    Patient ID: Nina Clark, female    DOB: 04/25/1966, 51 y.o.   MRN: [id number]  Nina Clark, a 51 year old female with a history of hypertension presents for a follow up. She says that she is taking medications consistently.  She is not exercising and is not adherent to low salt diet.  Patient denies chest pain, dyspnea, fatigue, irregular heart beat, lower extremity edema, palpitations, syncope and tachypnea.  Cardiovascular risk factors include: smoking/ tobacco exposure.     Hypertension  The problem is controlled. Pertinent negatives include no anxiety, chest pain, headaches, palpitations, peripheral edema, PND, shortness of breath or sweats. The current treatment provides significant improvement. Compliance problems include diet.   Gynecologic Exam  The patient's pertinent negatives include no genital itching, genital lesions, genital odor, genital rash, missed menses, pelvic pain, vaginal bleeding or vaginal discharge. This is a new problem. The patient is experiencing no pain. Pertinent negatives include no anorexia, back pain, chills, constipation, headaches, hematuria or painful intercourse. She is not sexually active.   Past Medical History:  Diagnosis Date  . Hypertension     Social History   Social History  . Marital status: Single    Spouse name: N/A  . Number of children: 1  . Years of education: College   Occupational History  .  Other    [employer]   Social History Main Topics  . Smoking status: Current Every Day Smoker    Packs/day: 0.25    Years: 25.00    Types: Cigarettes  . Smokeless tobacco: Never Used  . Alcohol use No  . Drug use: Yes    Types: Marijuana     Comment: daily  . Sexual activity: Yes    Birth control/ protection: None   Other Topics Concern  . Not on file   Social History Narrative   Patient lives with family.   Caffeine Use: none   Review of Systems  Constitutional: Negative.  Negative for chills and fatigue.   HENT: Negative.   Eyes: Negative.   Respiratory: Negative.  Negative for shortness of breath, wheezing and stridor.   Cardiovascular: Negative.  Negative for chest pain, palpitations, leg swelling and PND.  Gastrointestinal: Negative.  Negative for anorexia and constipation.  Endocrine: Negative.  Negative for cold intolerance, heat intolerance, polydipsia, polyphagia and polyuria.  Genitourinary: Negative.  Negative for hematuria, missed menses, pelvic pain and vaginal discharge.  Musculoskeletal: Negative.  Negative for back pain.  Skin: Negative.   Allergic/Immunologic: Negative.  Negative for immunocompromised state.  Neurological: Negative for dizziness (history of dizziness), facial asymmetry, weakness and headaches.  Hematological: Negative.   Psychiatric/Behavioral: Negative.        Objective:   Physical Exam  Constitutional: She is oriented to person, place, and time. She appears well-developed and well-nourished.  HENT:  Head: Normocephalic and atraumatic.  Right Ear: External ear normal.  Left Ear: External ear normal.  Nose: Nose normal.  Mouth/Throat: Oropharynx is clear and moist.  Eyes: Pupils are equal, round, and reactive to light. Conjunctivae and EOM are normal.  strabismus  Neck: Normal range of motion. Neck supple.  Cardiovascular: Normal rate, regular rhythm, normal heart sounds and intact distal pulses.   Pulmonary/Chest: Effort normal and breath sounds normal.  Abdominal: Soft. Bowel sounds are normal.  Genitourinary: There is no lesion on the right labia. There is no lesion on the left labia. Vaginal discharge found.  Musculoskeletal: Normal range of motion.  Neurological: She is  alert and oriented to person, place, and time. She has normal reflexes.  Skin: Skin is warm and dry.  Psychiatric: She has a normal mood and affect. Her behavior is normal. Judgment and thought content normal.      BP 122/83 (BP Location: Left Arm, Patient Position: Sitting,  Cuff Size: Normal)   Pulse 84   Temp 98.2 F (36.8 C) (Oral)   Resp 16   Ht 5' 4.5" (1.638 m)   Wt 160 lb (72.6 kg)   SpO2 99%   BMI 27.04 kg/m  Assessment & Plan:   1. Essential hypertension Blood pressure is at goal on current  Proteinuria present, consistent with previous results The patient is asked to make an attempt to improve diet and exercise patterns to aid in medical management of this problem.  - BASIC METABOLIC PANEL WITH GFR - hydrochlorothiazide (HYDRODIURIL) 25 MG tablet; Take 1 tablet (25 mg total) by mouth daily.  Dispense: 30 tablet; Refill: 5 - cloNIDine (CATAPRES) 0.1 MG tablet; Take 1 tablet (0.1 mg total) by mouth 2 (two) times daily.  Dispense: 60 tablet; Refill: 5  2. Pap smear, as part of routine gynecological examination Will follow up by phone with pap results.  - Cytology - PAP [place]  3. Vitamin D deficiency - Vitamin D, 25-hydroxy - ergocalciferol (DRISDOL) 50000 units capsule; Take 1 capsule (50,000 Units total) by mouth once a week.  Dispense: 30 capsule; Refill: 0 4. Tobacco dependence Smoking cessation instruction/counseling given:  counseled patient on the dangers of tobacco use, advised patient to stop smoking, and reviewed strategies to maximize success   Routine Health Maintenance: Recommend routine screening mammogram   RTC: 6 months for hypertension; will follow-up by phone with laboratory results  Nolon NationsLaChina Moore Arnett Galindez  MSN, FNP-C [facility] [address]  [place], [place] [id number] [phone]      The patient was given clear instructions to go to ER or return to medical center if symptoms do not improve, worsen or new problems develop. The patient verbalized understanding. Will notify patient with laboratory results.

## 2017-05-31 NOTE — Patient Instructions (Signed)
DASH Eating Plan DASH stands for "Dietary Approaches to Stop Hypertension." The DASH eating plan is a healthy eating plan that has been shown to reduce high blood pressure (hypertension). It may also reduce your risk for type 2 diabetes, heart disease, and stroke. The DASH eating plan may also help with weight loss. What are tips for following this plan? General guidelines  Avoid eating more than 2,300 mg (milligrams) of salt (sodium) a day. If you have hypertension, you may need to reduce your sodium intake to 1,500 mg a day.  Limit alcohol intake to no more than 1 drink a day for nonpregnant women and 2 drinks a day for men. One drink equals 12 oz of beer, 5 oz of wine, or 1 oz of hard liquor.  Work with your health care provider to maintain a healthy body weight or to lose weight. Ask what an ideal weight is for you.  Get at least 30 minutes of exercise that causes your heart to beat faster (aerobic exercise) most days of the week. Activities may include walking, swimming, or biking.  Work with your health care provider or diet and nutrition specialist (dietitian) to adjust your eating plan to your individual calorie needs. Reading food labels  Check food labels for the amount of sodium per serving. Choose foods with less than 5 percent of the Daily Value of sodium. Generally, foods with less than 300 mg of sodium per serving fit into this eating plan.  To find whole grains, look for the word "whole" as the first word in the ingredient list. Shopping  Buy products labeled as "low-sodium" or "no salt added."  Buy fresh foods. Avoid canned foods and premade or frozen meals. Cooking  Avoid adding salt when cooking. Use salt-free seasonings or herbs instead of table salt or sea salt. Check with your health care provider or pharmacist before using salt substitutes.  Do not fry foods. Cook foods using healthy methods such as baking, boiling, grilling, and broiling instead.  Cook with  heart-healthy oils, such as olive, canola, soybean, or sunflower oil. Meal planning   Eat a balanced diet that includes: ? 5 or more servings of fruits and vegetables each day. At each meal, try to fill half of your plate with fruits and vegetables. ? Up to 6-8 servings of whole grains each day. ? Less than 6 oz of lean meat, poultry, or fish each day. A 3-oz serving of meat is about the same size as a deck of cards. One egg equals 1 oz. ? 2 servings of low-fat dairy each day. ? A serving of nuts, seeds, or beans 5 times each week. ? Heart-healthy fats. Healthy fats called Omega-3 fatty acids are found in foods such as flaxseeds and coldwater fish, like sardines, salmon, and mackerel.  Limit how much you eat of the following: ? Canned or prepackaged foods. ? Food that is high in trans fat, such as fried foods. ? Food that is high in saturated fat, such as fatty meat. ? Sweets, desserts, sugary drinks, and other foods with added sugar. ? Full-fat dairy products.  Do not salt foods before eating.  Try to eat at least 2 vegetarian meals each week.  Eat more home-cooked food and less restaurant, buffet, and fast food.  When eating at a restaurant, ask that your food be prepared with less salt or no salt, if possible. What foods are recommended? The items listed may not be a complete list. Talk with your dietitian about what   dietary choices are best for you. Grains Whole-grain or whole-wheat bread. Whole-grain or whole-wheat pasta. Brown rice. Oatmeal. Quinoa. Bulgur. Whole-grain and low-sodium cereals. Pita bread. Low-fat, low-sodium crackers. Whole-wheat flour tortillas. Vegetables Fresh or frozen vegetables (raw, steamed, roasted, or grilled). Low-sodium or reduced-sodium tomato and vegetable juice. Low-sodium or reduced-sodium tomato sauce and tomato paste. Low-sodium or reduced-sodium canned vegetables. Fruits All fresh, dried, or frozen fruit. Canned fruit in natural juice (without  added sugar). Meat and other protein foods Skinless chicken or turkey. Ground chicken or turkey. Pork with fat trimmed off. Fish and seafood. Egg whites. Dried beans, peas, or lentils. Unsalted nuts, nut butters, and seeds. Unsalted canned beans. Lean cuts of beef with fat trimmed off. Low-sodium, lean deli meat. Dairy Low-fat (1%) or fat-free (skim) milk. Fat-free, low-fat, or reduced-fat cheeses. Nonfat, low-sodium ricotta or cottage cheese. Low-fat or nonfat yogurt. Low-fat, low-sodium cheese. Fats and oils Soft margarine without trans fats. Vegetable oil. Low-fat, reduced-fat, or light mayonnaise and salad dressings (reduced-sodium). Canola, safflower, olive, soybean, and sunflower oils. Avocado. Seasoning and other foods Herbs. Spices. Seasoning mixes without salt. Unsalted popcorn and pretzels. Fat-free sweets. What foods are not recommended? The items listed may not be a complete list. Talk with your dietitian about what dietary choices are best for you. Grains Baked goods made with fat, such as croissants, muffins, or some breads. Dry pasta or rice meal packs. Vegetables Creamed or fried vegetables. Vegetables in a cheese sauce. Regular canned vegetables (not low-sodium or reduced-sodium). Regular canned tomato sauce and paste (not low-sodium or reduced-sodium). Regular tomato and vegetable juice (not low-sodium or reduced-sodium). Pickles. Olives. Fruits Canned fruit in a light or heavy syrup. Fried fruit. Fruit in cream or butter sauce. Meat and other protein foods Fatty cuts of meat. Ribs. Fried meat. Bacon. Sausage. Bologna and other processed lunch meats. Salami. Fatback. Hotdogs. Bratwurst. Salted nuts and seeds. Canned beans with added salt. Canned or smoked fish. Whole eggs or egg yolks. Chicken or turkey with skin. Dairy Whole or 2% milk, cream, and half-and-half. Whole or full-fat cream cheese. Whole-fat or sweetened yogurt. Full-fat cheese. Nondairy creamers. Whipped toppings.  Processed cheese and cheese spreads. Fats and oils Butter. Stick margarine. Lard. Shortening. Ghee. Bacon fat. Tropical oils, such as coconut, palm kernel, or palm oil. Seasoning and other foods Salted popcorn and pretzels. Onion salt, garlic salt, seasoned salt, table salt, and sea salt. Worcestershire sauce. Tartar sauce. Barbecue sauce. Teriyaki sauce. Soy sauce, including reduced-sodium. Steak sauce. Canned and packaged gravies. Fish sauce. Oyster sauce. Cocktail sauce. Horseradish that you find on the shelf. Ketchup. Mustard. Meat flavorings and tenderizers. Bouillon cubes. Hot sauce and Tabasco sauce. Premade or packaged marinades. Premade or packaged taco seasonings. Relishes. Regular salad dressings. Where to find more information:  National Heart, Lung, and Blood Institute: www.nhlbi.nih.gov  American Heart Association: www.heart.org Summary  The DASH eating plan is a healthy eating plan that has been shown to reduce high blood pressure (hypertension). It may also reduce your risk for type 2 diabetes, heart disease, and stroke.  With the DASH eating plan, you should limit salt (sodium) intake to 2,300 mg a day. If you have hypertension, you may need to reduce your sodium intake to 1,500 mg a day.  When on the DASH eating plan, aim to eat more fresh fruits and vegetables, whole grains, lean proteins, low-fat dairy, and heart-healthy fats.  Work with your health care provider or diet and nutrition specialist (dietitian) to adjust your eating plan to your individual   calorie needs. This information is not intended to replace advice given to you by your health care provider. Make sure you discuss any questions you have with your health care provider. Document Released: 10/08/2011 Document Revised: 10/12/2016 Document Reviewed: 10/12/2016 Elsevier Interactive Patient Education  2017 Elsevier Inc. Pap Test Why am I having this test? A pap test is sometimes called a pap smear. It is a  screening test that is used to check for signs of cancer of the vagina, cervix, and uterus. The test can also identify the presence of infection or precancerous changes. Your health care provider will likely recommend you have this test done on a regular basis. This test may be done:  Every 3 years, starting at age 53.  Every 5 years, in combination with testing for the presence of human papillomavirus (HPV).  More or less often depending on other medical conditions.  What kind of sample is taken? Using a small cotton swab, plastic spatula, or brush, your health care provider will collect a sample of cells from the surface of your cervix. Your cervix is the opening to your uterus, also called a womb. Secretions from the cervix and vagina may also be collected. How do I prepare for this test?  Be aware of where you are in your menstrual cycle. You may be asked to reschedule the test if you are menstruating on the day of the test.  You may need to reschedule if you have a known vaginal infection on the day of the test.  You may be asked to avoid douching or taking a bath the day before or the day of the test.  Some medicines can cause abnormal test results, such as digitalis and tetracycline. Talk with your health care provider before your test if you take one of these medicines. What do the results mean? Abnormal test results may indicate a number of health conditions. These may include:  Cancer. Although pap test results cannot be used to diagnose cancer of the cervix, vagina, or uterus, they may suggest the possibility of cancer. Further tests would be required to determine if cancer is present.  Sexually transmitted disease.  Fungal infection.  Parasite infection.  Herpes infection.  A condition causing or contributing to infertility.  It is your responsibility to obtain your test results. Ask the lab or department performing the test when and how you will get your results.  Contact your health care provider to discuss any questions you have about your results. Talk with your health care provider to discuss your results, treatment options, and if necessary, the need for more tests. Talk with your health care provider if you have any questions about your results. This information is not intended to replace advice given to you by your health care provider. Make sure you discuss any questions you have with your health care provider. Document Released: 01/09/2003 Document Revised: 06/24/2016 Document Reviewed: 03/12/2014 Elsevier Interactive Patient Education  [employer]2018 Elsevier Inc.

## 2017-06-01 ENCOUNTER — Telehealth: Payer: Self-pay

## 2017-06-01 LAB — CYTOLOGY - PAP: Diagnosis: NEGATIVE

## 2017-06-01 LAB — VITAMIN D 25 HYDROXY (VIT D DEFICIENCY, FRACTURES): Vit D, 25-Hydroxy: 55 ng/mL (ref 30–100)

## 2017-06-01 NOTE — Telephone Encounter (Signed)
-----   Message from Massie MaroonLachina M Hollis, [place] sent at 06/01/2017  4:58 AM EDT ----- Regarding: lab results  Please inform Ms. Pohlmann that vitamin D has normalized. Will discontinue weekly Vitamin d. Start OTC vitamin D 1000 IU daily.   All other labs within normal limits  Thanks ----- Message ----- From: Interface, Lab In Three Zero Five Sent: 05/31/2017  11:56 PM To: Massie MaroonLachina M Hollis, FNP

## 2017-06-01 NOTE — Telephone Encounter (Signed)
Called and spoke with patient, advised of vitamin D levels now in range. Advised patient not to take vitamin D 50,000 units once weekly but to now take otc vitamin D 1000 units daily. Patient verbalized understanding. Advised that all other labs were WNL. Thanks!

## 2017-06-08 ENCOUNTER — Other Ambulatory Visit: Payer: Self-pay | Admitting: Obstetrics and Gynecology

## 2017-06-08 DIAGNOSIS — Z1231 Encounter for screening mammogram for malignant neoplasm of breast: Secondary | ICD-10-CM

## 2017-06-10 ENCOUNTER — Other Ambulatory Visit: Payer: Self-pay

## 2017-06-10 DIAGNOSIS — Z8739 Personal history of other diseases of the musculoskeletal system and connective tissue: Secondary | ICD-10-CM

## 2017-06-10 MED ORDER — MELOXICAM 15 MG PO TABS: 15.0000 mg | ORAL_TABLET | Freq: Every day | ORAL | 5 refills | Status: DC

## 2017-06-10 MED FILL — ?MELOXICAM 15MG TABLET: 15 | 30 days supply | Qty: 30 | Fill #0

## 2017-06-22 ENCOUNTER — Ambulatory Visit (HOSPITAL_COMMUNITY)
Admission: RE | Admit: 2017-06-22 | Discharge: 2017-06-22 | Disposition: A | Discharge status: Home / Self Care | Payer: Self-pay | Source: Ambulatory Visit | Attending: Obstetrics and Gynecology | Admitting: Obstetrics and Gynecology

## 2017-06-22 ENCOUNTER — Encounter (HOSPITAL_COMMUNITY): Payer: Self-pay

## 2017-06-22 ENCOUNTER — Ambulatory Visit
Admission: RE | Admit: 2017-06-22 | Discharge: 2017-06-22 | Disposition: A | Discharge status: Home / Self Care | Payer: Self-pay | Source: Ambulatory Visit | Attending: Obstetrics and Gynecology | Admitting: Obstetrics and Gynecology

## 2017-06-22 ENCOUNTER — Other Ambulatory Visit: Payer: Self-pay | Admitting: Obstetrics and Gynecology

## 2017-06-22 VITALS — BP 132/85 | HR 58 | Temp 97.8°F | Ht 64.5 in | Wt 159.4 lb

## 2017-06-22 DIAGNOSIS — N6311 Unspecified lump in the right breast, upper outer quadrant: Secondary | ICD-10-CM

## 2017-06-22 DIAGNOSIS — Z1239 Encounter for other screening for malignant neoplasm of breast: Secondary | ICD-10-CM

## 2017-06-22 DIAGNOSIS — Z1231 Encounter for screening mammogram for malignant neoplasm of breast: Secondary | ICD-10-CM

## 2017-06-22 DIAGNOSIS — N6310 Unspecified lump in the right breast, unspecified quadrant: Secondary | ICD-10-CM

## 2017-06-22 NOTE — Patient Instructions (Addendum)
Explained breast self awareness with Nina Clark. Patient did not need a Pap smear today due to last Pap smear was 05/31/2017. Let her know [facility] will cover Pap smears every 3 years unless has a history of abnormal Pap smears. Referred patient to the [facility] for diagnostic mammogram and possible right breast ultrasound. Appointment scheduled for Wednesday, June 30, 2017 at 1030. Patient aware of appointment and will be there. Discussed smoking cessation with patient. Referred patient to the [facility] Quitline and gave resources to free smoking cessation classes [facility]. Nina Clark verbalized understanding.  Ryan Ogborn, Kathaleen Maser, RN 1:33 PM

## 2017-06-22 NOTE — Progress Notes (Signed)
No complaints today.   Pap Smear: Pap smear not completed today. Last Pap smear was 05/31/2017 at [facility] and normal. Per patient has no history of an abnormal Pap smear. Last Pap smear result is in EPIC.  Physical exam: Breasts Breasts symmetrical. No skin abnormalities bilateral breasts. No nipple retraction bilateral breasts. No nipple discharge bilateral breasts. No lymphadenopathy. No lumps palpated left breast. Palpated a lump within the right breast at 11 o'clock 4 cm from the nipple. No complaints of pain or tenderness on exam. Referred patient to the [facility] for diagnostic mammogram and possible right breast ultrasound. Appointment scheduled for Wednesday, June 30, 2017 at 1030.        Pelvic/Bimanual No Pap smear completed today since last Pap smear was 05/31/2017. Pap smear not indicated per BCCCP guidelines.   Smoking History: Patient is a current smoker. Discussed smoking cessation with patient. Referred patient to the [facility] Quitline and gave resources to free smoking cessation classes at [facility].  Patient Navigation: Patient education provided. Access to services provided for patient through B[facility]CP program.   Colorectal Cancer Screening: Per patient has never had a colonoscopy completed. No complaints today. FIT Test given to patient to complete and return to [facility].

## 2017-06-30 ENCOUNTER — Other Ambulatory Visit: Payer: No Typology Code available for payment source

## 2017-07-12 MED FILL — cloNIDine HCL 0.1 MG TABS: 0.1 | 30 days supply | Qty: 60 | Fill #1

## 2017-07-12 MED FILL — OMEPRAZOLE DR 40 MG CAPSULE: 40 | 30 days supply | Qty: 30 | Fill #1

## 2017-07-12 MED FILL — HYDROCHLOROTHIAZIDE 25 MG T: 25 | 30 days supply | Qty: 30 | Fill #1

## 2017-07-12 MED FILL — ?MELOXICAM 15MG TABLET: 15 | 30 days supply | Qty: 30 | Fill #1

## 2017-08-09 ENCOUNTER — Telehealth: Payer: Self-pay | Admitting: Family Medicine

## 2017-08-09 ENCOUNTER — Encounter: Payer: Self-pay | Admitting: Family Medicine

## 2017-08-09 ENCOUNTER — Ambulatory Visit (INDEPENDENT_AMBULATORY_CARE_PROVIDER_SITE_OTHER): Payer: Self-pay | Admitting: Family Medicine

## 2017-08-09 VITALS — BP 134/81 | HR 60 | Temp 98.0°F | Resp 16 | Ht 64.5 in | Wt 156.0 lb

## 2017-08-09 DIAGNOSIS — M62838 Other muscle spasm: Secondary | ICD-10-CM

## 2017-08-09 DIAGNOSIS — R079 Chest pain, unspecified: Secondary | ICD-10-CM

## 2017-08-09 DIAGNOSIS — R0781 Pleurodynia: Secondary | ICD-10-CM

## 2017-08-09 DIAGNOSIS — R071 Chest pain on breathing: Secondary | ICD-10-CM

## 2017-08-09 DIAGNOSIS — F17200 Nicotine dependence, unspecified, uncomplicated: Secondary | ICD-10-CM

## 2017-08-09 LAB — CBC
HCT: 33.6 % — ABNORMAL LOW (ref 35.0–45.0)
HEMOGLOBIN: 11.6 g/dL — AB (ref 11.7–15.5)
MCH: 30 pg (ref 27.0–33.0)
MCHC: 34.5 g/dL (ref 32.0–36.0)
MCV: 86.8 fL (ref 80.0–100.0)
MPV: 10 fL (ref 7.5–12.5)
PLATELETS: 346 10*3/uL (ref 140–400)
RBC: 3.87 10*6/uL (ref 3.80–5.10)
RDW: 12.8 % (ref 11.0–15.0)
WBC: 6.2 10*3/uL (ref 3.8–10.8)

## 2017-08-09 LAB — POCT URINALYSIS DIP (DEVICE)
BILIRUBIN URINE: NEGATIVE
GLUCOSE, UA: NEGATIVE mg/dL
Hgb urine dipstick: NEGATIVE
KETONES UR: NEGATIVE mg/dL
Leukocytes, UA: NEGATIVE
Nitrite: NEGATIVE
Protein, ur: NEGATIVE mg/dL
SPECIFIC GRAVITY, URINE: 1.025 (ref 1.005–1.030)
Urobilinogen, UA: 0.2 mg/dL (ref 0.0–1.0)
pH: 5.5 (ref 5.0–8.0)

## 2017-08-09 LAB — D-DIMER, QUANTITATIVE (NOT AT ARMC): D DIMER QUANT: 0.32 ug{FEU}/mL (ref <0.50)

## 2017-08-09 MED ORDER — CYCLOBENZAPRINE HCL 5 MG PO TABS: 5.0000 mg | ORAL_TABLET | Freq: Three times a day (TID) | ORAL | 1 refills | Status: DC | PRN

## 2017-08-09 NOTE — Progress Notes (Signed)
Subjective:    Nina Clark is a 51 y.o. female who presents for evaluation of left rib pain with deep breathing.  Onset of symptoms was several weeks ago. She says that pain started in right upper back but has now moved to left rib cage. Pain is worsened with deep breathing. Symptoms have been worsening over the past 2 days. She heart palpations, dizziness, syncope, heart burn, persistent coughing. She also denies recent travel.  The patient describes the pain as sharp and does not radiate. Patient rates pain as sharp in intensity and does not radiate. She has not identified any palliative or provocative factors associated with current symptom. A. Patient's cardiac risk factors are: sedentary lifestyle and smoking/ tobacco exposure. Patient does not have risk factors for DVT.   Past Medical History:  Diagnosis Date  . Hypertension    Social History   Social History  . Marital status: Single    Spouse name: N/A  . Number of children: 1  . Years of education: College   Occupational History  .  Other    [employer]   Social History Main Topics  . Smoking status: Current Every Day Smoker    Packs/day: 0.25    Years: 25.00    Types: Cigarettes  . Smokeless tobacco: Never Used  . Alcohol use No  . Drug use: Yes    Types: Marijuana     Comment: daily  . Sexual activity: Yes    Birth control/ protection: None   Other Topics Concern  . Not on file   Social History Narrative   Patient lives with family.   Caffeine Use: none   Immunization History  Administered Date(s) Administered  . Tdap 05/18/2016  Review of Systems  Constitutional: Negative.  Negative for fever.  HENT: Negative.   Eyes: Negative.   Respiratory:       Pain with deep breathing  Cardiovascular: Negative.   Gastrointestinal: Negative.   Genitourinary: Negative.   Musculoskeletal: Negative.   Skin: Negative.   Neurological: Negative.   Endo/Heme/Allergies: Negative.   Psychiatric/Behavioral: Negative.     Objective:    BP 134/81 (BP Location: Right Arm, Patient Position: Sitting, Cuff Size: Normal)   Pulse 60   Temp 98 F (36.7 C) (Oral)   Resp 16   Ht 5' 4.5" (1.638 m)   Wt 156 lb (70.8 kg)   SpO2 100%   BMI 26.36 kg/m   General Appearance:    Alert, cooperative, mild distress, appears stated age  Head:    Normocephalic, without obvious abnormality, atraumatic  Ears:    Normal TM's and external ear canals, both ears  Nose:   Nares normal, septum midline, mucosa normal, no drainage    or sinus tenderness  Neck:   Supple, symmetrical, trachea midline, no adenopathy;    thyroid:  no enlargement/tenderness/nodules; no carotid   bruit or JVD  Back:     Symmetric, no curvature, ROM normal, no CVA tenderness  Lungs:     Clear to auscultation bilaterally, respirations unlabored  Chest Wall:     Tenderness to left lower rib cage on palpation, No deformity   Heart:    Regular rate and rhythm, S1 and S2 normal, no murmur, rub   or gallop  Abdomen:     Soft, non-tender, bowel sounds active all four quadrants,    no masses, no organomegaly  Extremities:   Extremities normal, atraumatic, no cyanosis or edema  Pulses:   2+ and symmetric all extremities  Skin:   Skin color, texture, turgor normal, no rashes or lesions  Lymph nodes:   Cervical, supraclavicular, and axillary nodes normal  Neurologic:   CNII-XII intact, normal strength, sensation and reflexes    throughout    Assessment:    Chest pain, suspected etiology: chest wall pain    Plan:  1. Chest pain varying with breathing Do not suspect a cardiac etiology.  Will follow up by phone after reviewing stat laboratory values.  - D-dimer, quantitative (not at [facility]) - CBC - COMPLETE METABOLIC PANEL WITH GFR  2. Rib pain on left side Will follow up after reviewing stat laboratory values  3. Tobacco dependence Smoking cessation instruction/counseling given:  counseled patient on the dangers of tobacco use, advised patient to stop  smoking, and reviewed strategies to maximize success    RTC: Follow up as previously scheduled.    The patient was given clear instructions to go to ER or return to medical center if symptoms do not improve, worsen or new problems develop. The patient verbalized understanding.     Nolon Nations  MSN, FNP-C Patient Care [facility] [address] [place], [place] [address] [phone]

## 2017-08-09 NOTE — Telephone Encounter (Signed)
Reviewed labs, I suspect muscle spasm to right side. Will start a trial of cyclobenzaprine  Meds ordered this encounter  Medications  . cyclobenzaprine (FLEXERIL) 5 MG tablet    Sig: Take 1 tablet (5 mg total) by mouth 3 (three) times daily as needed for muscle spasms.    Dispense:  30 tablet    Refill:  1    Nolon Nations  MSN, FNP-C [facility] [address] [place], [place] [address] [phone]

## 2017-08-09 NOTE — Patient Instructions (Addendum)
Will follow up by phone with lab results  If D-dimer is elevate, will send orders for a stat CT

## 2017-08-10 ENCOUNTER — Telehealth: Payer: Self-pay

## 2017-08-10 LAB — COMPLETE METABOLIC PANEL WITH GFR
AG RATIO: 1.4 (calc) (ref 1.0–2.5)
ALT: 14 U/L (ref 6–29)
AST: 19 U/L (ref 10–35)
Albumin: 4.2 g/dL (ref 3.6–5.1)
Alkaline phosphatase (APISO): 51 U/L (ref 33–130)
BILIRUBIN TOTAL: 0.5 mg/dL (ref 0.2–1.2)
BUN: 17 mg/dL (ref 7–25)
CHLORIDE: 100 mmol/L (ref 98–110)
CO2: 28 mmol/L (ref 20–32)
Calcium: 9.9 mg/dL (ref 8.6–10.4)
Creat: 0.73 mg/dL (ref 0.50–1.05)
GFR, EST AFRICAN AMERICAN: 109 mL/min/{1.73_m2} (ref >=60)
GFR, Est Non African American: 94 mL/min/{1.73_m2} (ref >=60)
GLOBULIN: 3.1 g/dL (ref 1.9–3.7)
Glucose, Bld: 93 mg/dL (ref 65–99)
POTASSIUM: 3.5 mmol/L (ref 3.5–5.3)
SODIUM: 139 mmol/L (ref 135–146)
Total Protein: 7.3 g/dL (ref 6.1–8.1)

## 2017-08-10 MED FILL — OMEPRAZOLE DR 40 MG CAPSULE: 40 | 30 days supply | Qty: 30 | Fill #2

## 2017-08-10 MED FILL — ?MELOXICAM 15MG TABLET: 15 | 30 days supply | Qty: 30 | Fill #2

## 2017-08-10 MED FILL — CYCLOBENZAPRINE 5 MG TABLET: 5 | 10 days supply | Qty: 30 | Fill #0

## 2017-08-10 MED FILL — cloNIDine HCL 0.1 MG TABS: 0.1 | 30 days supply | Qty: 60 | Fill #2

## 2017-08-10 MED FILL — HYDROCHLOROTHIAZIDE 25 MG T: 25 | 30 days supply | Qty: 30 | Fill #2

## 2017-08-10 NOTE — Telephone Encounter (Signed)
Called and spoke with patient, advised that hemoglobin is low and this is consistent with mild anemia and she needs to take ferrous sulfate  once daily. Also recommenced that patient eat a diet rich in iron such as green leafy veggies, beans, proteins, and organ meats. Patient verbalized understanding. Thanks!

## 2017-08-10 NOTE — Telephone Encounter (Signed)
-----   Message from Massie Maroon, [place] sent at 08/10/2017  8:14 AM EDT ----- Regarding: lab results Please inform patient that hemoglobin is consistent with mild anemia, continue Ferrous sulfate 325 mg daily. Also recommend an iron rich diet (green leafy veggies, beans, proteins, and organ meats.   Follow up in office as scheduled.   Thanks

## 2017-09-15 MED FILL — MELOXICAM 15 MG TABLET: 15 | 30 days supply | Qty: 30 | Fill #3

## 2017-09-15 MED FILL — HYDROCHLOROTHIAZIDE 25 MG T: 25 | 30 days supply | Qty: 30 | Fill #3

## 2017-09-15 MED FILL — OMEPRAZOLE DR 40 MG CAPSULE: 40 | 30 days supply | Qty: 30 | Fill #3

## 2017-09-15 MED FILL — ?CLONIDINE HCL 0.1 MG TABL: 0.1 | 30 days supply | Qty: 60 | Fill #3

## 2017-10-13 MED FILL — MELOXICAM 15 MG TABLET: 15 | 30 days supply | Qty: 30 | Fill #4

## 2017-10-13 MED FILL — ?CLONIDINE HCL 0.1 MG TABL: 0.1 | 30 days supply | Qty: 60 | Fill #4

## 2017-10-13 MED FILL — OMEPRAZOLE DR 40 MG CAPSULE: 40 | 30 days supply | Qty: 30 | Fill #4

## 2017-10-13 MED FILL — HYDROCHLOROTHIAZIDE 25 MG T: 25 | 30 days supply | Qty: 30 | Fill #4

## 2017-11-11 MED FILL — MELOXICAM 15 MG TABLET: 15 | 30 days supply | Qty: 30 | Fill #5

## 2017-11-11 MED FILL — OMEPRAZOLE DR 40 MG CAPSULE: 40 | 30 days supply | Qty: 30 | Fill #5

## 2017-11-11 MED FILL — HYDROCHLOROTHIAZIDE 25 MG T: 25 | 30 days supply | Qty: 30 | Fill #5

## 2017-11-11 MED FILL — ?CLONIDINE HCL 0.1 MG TABL: 0.1 | 30 days supply | Qty: 60 | Fill #5

## 2017-11-22 ENCOUNTER — Ambulatory Visit: Payer: Self-pay | Attending: Internal Medicine

## 2017-12-01 ENCOUNTER — Ambulatory Visit (INDEPENDENT_AMBULATORY_CARE_PROVIDER_SITE_OTHER): Payer: Self-pay | Admitting: Family Medicine

## 2017-12-01 ENCOUNTER — Encounter: Payer: Self-pay | Admitting: Family Medicine

## 2017-12-01 VITALS — BP 139/76 | HR 65 | Temp 97.6°F | Resp 16 | Ht 64.5 in | Wt 161.0 lb

## 2017-12-01 DIAGNOSIS — F17200 Nicotine dependence, unspecified, uncomplicated: Secondary | ICD-10-CM

## 2017-12-01 DIAGNOSIS — D649 Anemia, unspecified: Secondary | ICD-10-CM

## 2017-12-01 DIAGNOSIS — I10 Essential (primary) hypertension: Secondary | ICD-10-CM

## 2017-12-01 DIAGNOSIS — Z8739 Personal history of other diseases of the musculoskeletal system and connective tissue: Secondary | ICD-10-CM

## 2017-12-01 DIAGNOSIS — K219 Gastro-esophageal reflux disease without esophagitis: Secondary | ICD-10-CM

## 2017-12-01 LAB — POCT URINALYSIS DIP (DEVICE)
Bilirubin Urine: NEGATIVE
Glucose, UA: NEGATIVE mg/dL
Hgb urine dipstick: NEGATIVE
Ketones, ur: NEGATIVE mg/dL
Leukocytes, UA: NEGATIVE
NITRITE: NEGATIVE
PH: 5.5 (ref 5.0–8.0)
PROTEIN: NEGATIVE mg/dL
Specific Gravity, Urine: 1.025 (ref 1.005–1.030)
Urobilinogen, UA: 0.2 mg/dL (ref 0.0–1.0)

## 2017-12-01 MED ORDER — OMEPRAZOLE 40 MG PO CPDR: 40.0000 mg | DELAYED_RELEASE_CAPSULE | Freq: Every day | ORAL | 5 refills | Status: DC

## 2017-12-01 MED ORDER — MELOXICAM 15 MG PO TABS: 15.0000 mg | ORAL_TABLET | Freq: Every day | ORAL | 5 refills | Status: DC

## 2017-12-01 MED ORDER — HYDROCHLOROTHIAZIDE 25 MG PO TABS: 25.0000 mg | ORAL_TABLET | Freq: Every day | ORAL | 5 refills | Status: DC

## 2017-12-01 MED ORDER — CLONIDINE HCL 0.1 MG PO TABS: 0.1000 mg | ORAL_TABLET | Freq: Two times a day (BID) | ORAL | 5 refills | Status: DC

## 2017-12-01 NOTE — Patient Instructions (Addendum)
Your blood pressure is at goal on today, no medication changes warranted. Will follow up by phone with any abnormal laboratory results.   DASH Eating Plan DASH stands for "Dietary Approaches to Stop Hypertension." The DASH eating plan is a healthy eating plan that has been shown to reduce high blood pressure (hypertension). It may also reduce your risk for type 2 diabetes, heart disease, and stroke. The DASH eating plan may also help with weight loss. What are tips for following this plan? General guidelines  Avoid eating more than 2,300 mg (milligrams) of salt (sodium) a day. If you have hypertension, you may need to reduce your sodium intake to 1,500 mg a day.  Limit alcohol intake to no more than 1 drink a day for nonpregnant women and 2 drinks a day for men. One drink equals 12 oz of beer, 5 oz of wine, or 1 oz of hard liquor.  Work with your health care provider to maintain a healthy body weight or to lose weight. Ask what an ideal weight is for you.  Get at least 30 minutes of exercise that causes your heart to beat faster (aerobic exercise) most days of the week. Activities may include walking, swimming, or biking.  Work with your health care provider or diet and nutrition specialist (dietitian) to adjust your eating plan to your individual calorie needs. Reading food labels  Check food labels for the amount of sodium per serving. Choose foods with less than 5 percent of the Daily Value of sodium. Generally, foods with less than 300 mg of sodium per serving fit into this eating plan.  To find whole grains, look for the word "whole" as the first word in the ingredient list. Shopping  Buy products labeled as "low-sodium" or "no salt added."  Buy fresh foods. Avoid canned foods and premade or frozen meals. Cooking  Avoid adding salt when cooking. Use salt-free seasonings or herbs instead of table salt or sea salt. Check with your health care provider or pharmacist before using salt  substitutes.  Do not fry foods. Cook foods using healthy methods such as baking, boiling, grilling, and broiling instead.  Cook with heart-healthy oils, such as olive, canola, soybean, or sunflower oil. Meal planning   Eat a balanced diet that includes: ? 5 or more servings of fruits and vegetables each day. At each meal, try to fill half of your plate with fruits and vegetables. ? Up to 6-8 servings of whole grains each day. ? Less than 6 oz of lean meat, poultry, or fish each day. A 3-oz serving of meat is about the same size as a deck of cards. One egg equals 1 oz. ? 2 servings of low-fat dairy each day. ? A serving of nuts, seeds, or beans 5 times each week. ? Heart-healthy fats. Healthy fats called Omega-3 fatty acids are found in foods such as flaxseeds and coldwater fish, like sardines, salmon, and mackerel.  Limit how much you eat of the following: ? Canned or prepackaged foods. ? Food that is high in trans fat, such as fried foods. ? Food that is high in saturated fat, such as fatty meat. ? Sweets, desserts, sugary drinks, and other foods with added sugar. ? Full-fat dairy products.  Do not salt foods before eating.  Try to eat at least 2 vegetarian meals each week.  Eat more home-cooked food and less restaurant, buffet, and fast food.  When eating at a restaurant, ask that your food be prepared with less salt  or no salt, if possible. What foods are recommended? The items listed may not be a complete list. Talk with your dietitian about what dietary choices are best for you. Grains Whole-grain or whole-wheat bread. Whole-grain or whole-wheat pasta. Brown rice. [place] Morrow. Bulgur. Whole-grain and low-sodium cereals. Pita bread. Low-fat, low-sodium crackers. Whole-wheat flour tortillas. Vegetables Fresh or frozen vegetables (raw, steamed, roasted, or grilled). Low-sodium or reduced-sodium tomato and vegetable juice. Low-sodium or reduced-sodium tomato sauce and tomato  paste. Low-sodium or reduced-sodium canned vegetables. Fruits All fresh, dried, or frozen fruit. Canned fruit in natural juice (without added sugar). Meat and other protein foods Skinless chicken or [place]. Ground chicken or [place]. Pork with fat trimmed off. Fish and seafood. Egg whites. Dried beans, peas, or lentils. Unsalted nuts, nut butters, and seeds. Unsalted canned beans. Lean cuts of beef with fat trimmed off. Low-sodium, lean deli meat. Dairy Low-fat (1%) or fat-free (skim) milk. Fat-free, low-fat, or reduced-fat cheeses. Nonfat, low-sodium ricotta or cottage cheese. Low-fat or nonfat yogurt. Low-fat, low-sodium cheese. Fats and oils Soft margarine without trans fats. Vegetable oil. Low-fat, reduced-fat, or light mayonnaise and salad dressings (reduced-sodium). Canola, safflower, olive, soybean, and sunflower oils. Avocado. Seasoning and other foods Herbs. Spices. Seasoning mixes without salt. Unsalted popcorn and pretzels. Fat-free sweets. What foods are not recommended? The items listed may not be a complete list. Talk with your dietitian about what dietary choices are best for you. Grains Baked goods made with fat, such as croissants, muffins, or some breads. Dry pasta or rice meal packs. Vegetables Creamed or fried vegetables. Vegetables in a cheese sauce. Regular canned vegetables (not low-sodium or reduced-sodium). Regular canned tomato sauce and paste (not low-sodium or reduced-sodium). Regular tomato and vegetable juice (not low-sodium or reduced-sodium). Angie Fava. Olives. Fruits Canned fruit in a light or heavy syrup. Fried fruit. Fruit in cream or butter sauce. Meat and other protein foods Fatty cuts of meat. Ribs. Fried meat. Berniece Salines. Sausage. Bologna and other processed lunch meats. Salami. Fatback. Hotdogs. Bratwurst. Salted nuts and seeds. Canned beans with added salt. Canned or smoked fish. Whole eggs or egg yolks. Chicken or [place] with skin. Dairy Whole or 2% milk,  cream, and half-and-half. Whole or full-fat cream cheese. Whole-fat or sweetened yogurt. Full-fat cheese. Nondairy creamers. Whipped toppings. Processed cheese and cheese spreads. Fats and oils Butter. Stick margarine. Lard. Shortening. Ghee. Bacon fat. Tropical oils, such as coconut, palm kernel, or palm oil. Seasoning and other foods Salted popcorn and pretzels. Onion salt, garlic salt, seasoned salt, table salt, and sea salt. Worcestershire sauce. Tartar sauce. Barbecue sauce. Teriyaki sauce. Soy sauce, including reduced-sodium. Steak sauce. Canned and packaged gravies. Fish sauce. Oyster sauce. Cocktail sauce. Horseradish that you find on the shelf. Ketchup. Mustard. Meat flavorings and tenderizers. Bouillon cubes. Hot sauce and Tabasco sauce. Premade or packaged marinades. Premade or packaged taco seasonings. Relishes. Regular salad dressings. Where to find more information:  National Heart, Lung, and [place]: https://wilson-eaton.com/  American Heart Association: www.heart.org Summary  The DASH eating plan is a healthy eating plan that has been shown to reduce high blood pressure (hypertension). It may also reduce your risk for type 2 diabetes, heart disease, and stroke.  With the DASH eating plan, you should limit salt (sodium) intake to 2,300 mg a day. If you have hypertension, you may need to reduce your sodium intake to 1,500 mg a day.  When on the DASH eating plan, aim to eat more fresh fruits and vegetables, whole grains, lean proteins, low-fat dairy,  and heart-healthy fats.  Work with your health care provider or diet and nutrition specialist (dietitian) to adjust your eating plan to your individual calorie needs. This information is not intended to replace advice given to you by your health care provider. Make sure you discuss any questions you have with your health care provider. Document Released: 10/08/2011 Document Revised: 10/12/2016 Document Reviewed: 10/12/2016 Elsevier  Interactive Patient Education  [employer]2018 Elsevier Inc.

## 2017-12-01 NOTE — Progress Notes (Signed)
Subjective:    Patient ID: Nina Clark, female    DOB: Apr 14, 1966, 52 y.o.   MRN: [id number]  HPI Nina Clark, a 52 year old female with a history of hypertension, arthritis,  and GERD presents for a follow up of chronic conditions. She says that she is taking medications consistently.  She is not exercising and is not adherent to low salt diet. She does not check blood pressures at home. Patient denies chest pain, dyspnea, fatigue, irregular heart beat, lower extremity edema, palpitations, syncope and tachypnea.  Cardiovascular risk factors include: smoking/ tobacco exposure.  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 Patient is not having pain at present.    Paitent has a history of GERD. Symptoms continue to be controlled on Omeprazole 40 mg daily. Symptoms have been associated with fullness after meals and heartburn.  She denies abdominal bloating, belching and eructation, chest pain, choking on food, cough, hematemesis, hoarseness and laryngitis.  She denies dysphagia.  She has not lost weight. She denies melena, hematochezia, hematemesis, and coffee ground emesis.  Past Medical History:  Diagnosis Date  . Hypertension     Social History   Socioeconomic History  . Marital status: Single    Spouse name: Not on file  . Number of children: 1  . Years of education: College  . Highest education level: Not on file  Social Needs  . Financial resource strain: Not on file  . Food insecurity - worry: Not on file  . Food insecurity - inability: Not on file  . Transportation needs - medical: Not on file  . Transportation needs - non-medical: Not on file  Occupational History    Employer: OTHER    Comment: [employer]  Tobacco Use  . Smoking status: Current Every Day Smoker    Packs/day: 0.25    Years: 25.00    Pack years: 6.25    Types: Cigarettes  . Smokeless tobacco: Never Used  Substance and Sexual Activity  . Alcohol use: No   Alcohol/week: 0.0 oz  . Drug use: Yes    Types: Marijuana    Comment: daily  . Sexual activity: Yes    Birth control/protection: None  Other Topics Concern  . Not on file  Social History Narrative   Patient lives with family.   Caffeine Use: none   Review of Systems  Constitutional: Negative.  Negative for fatigue.  HENT: Negative.   Eyes: Negative.   Respiratory: Negative.  Negative for cough, wheezing and stridor.   Cardiovascular: Negative.  Negative for leg swelling.  Gastrointestinal: Negative.   Endocrine: Negative.  Negative for cold intolerance, heat intolerance, polydipsia, polyphagia and polyuria.  Genitourinary: Negative.  Negative for frequency, pelvic pain and urgency.  Musculoskeletal: Negative.   Skin: Negative.  Negative for pallor and rash.  Allergic/Immunologic: Negative.  Negative for immunocompromised state.  Neurological: Negative for dizziness (history of dizziness), facial asymmetry and weakness.  Hematological: Negative.   Psychiatric/Behavioral: Negative.        Objective:   Physical Exam  Constitutional: She is oriented to person, place, and time. She appears well-developed and well-nourished.  HENT:  Head: Normocephalic and atraumatic.  Right Ear: External ear normal.  Left Ear: External ear normal.  Nose: Nose normal.  Mouth/Throat: Oropharynx is clear and moist.  Eyes: Conjunctivae and EOM are normal. Pupils are equal, round, and reactive to light.  strabismus  Neck: Normal range of motion. Neck supple.  Cardiovascular: Normal rate, regular  rhythm, normal heart sounds and intact distal pulses.  Pulmonary/Chest: Effort normal and breath sounds normal.  Abdominal: Soft. Bowel sounds are normal.  Musculoskeletal: Normal range of motion.  Neurological: She is alert and oriented to person, place, and time. She has normal reflexes.  Skin: Skin is warm and dry.  Psychiatric: She has a normal mood and affect. Her behavior is normal. Judgment and  thought content normal.      BP 139/76 (BP Location: Right Arm, Patient Position: Sitting, Cuff Size: Large)   Pulse 65   Temp 97.6 F (36.4 C) (Oral)   Resp 16   Ht 5' 4.5" (1.638 Clark)   Wt 161 lb (73 kg)   SpO2 100%   BMI 27.21 kg/Clark  Assessment & Plan:   1. Essential hypertension Blood pressure is at goal on current medication regimen.  No medication changes warranted on today.  - Continue medication, monitor blood pressure at home. Continue DASH diet. Reminder to go to the ER if any CP, SOB, nausea, dizziness, severe HA, changes vision/speech, left arm numbness and tingling and jaw pain. Reviewed urinalysis, no proteinuria present, will review renal functioning as results become available.  - POCT urinalysis dip (device) - Basic Metabolic Panel - hydrochlorothiazide (HYDRODIURIL) 25 MG tablet; Take 1 tablet (25 mg total) by mouth daily.  Dispense: 30 tablet; Refill: 5 - cloNIDine (CATAPRES) 0.1 MG tablet; Take 1 tablet (0.1 mg total) by mouth 2 (two) times daily.  Dispense: 60 tablet; Refill: 5  2. Anemia, unspecified type Recommend continuing an iron rich diet as discussed.  - CBC  3. History of arthritis - meloxicam (MOBIC) 15 MG tablet; Take 1 tablet (15 mg total) by mouth daily.  Dispense: 30 tablet; Refill: 5  4. Gastroesophageal reflux disease without esophagitis - omeprazole (PRILOSEC) 40 MG capsule; Take 1 capsule (40 mg total) by mouth daily.  Dispense: 30 capsule; Refill: 5  5. Tobacco dependence Smoking cessation instruction/counseling given:  counseled patient on the dangers of tobacco use, advised patient to stop smoking, and reviewed strategies to maximize success    Routine Health Maintenance:   Recommend routine screening mammogram   RTC: 6 months for hypertension; will follow-up by phone with laboratory results  Nina Vassell M, FNP      The patient was given clear instructions to go to ER or return to medical center if symptoms do not improve,  worsen or new problems develop. The patient verbalized understanding. Will notify patient with laboratory results.

## 2017-12-02 LAB — CBC
HEMATOCRIT: 36.7 % (ref 34.0–46.6)
HEMOGLOBIN: 12.4 g/dL (ref 11.1–15.9)
MCH: 30 pg (ref 26.6–33.0)
MCHC: 33.8 g/dL (ref 31.5–35.7)
MCV: 89 fL (ref 79–97)
Platelets: 363 10*3/uL (ref 150–379)
RBC: 4.14 x10E6/uL (ref 3.77–5.28)
RDW: 14.1 % (ref 12.3–15.4)
WBC: 5.8 10*3/uL (ref 3.4–10.8)

## 2017-12-02 LAB — BASIC METABOLIC PANEL
BUN/Creatinine Ratio: 19 (ref 9–23)
BUN: 15 mg/dL (ref 6–24)
CALCIUM: 9.7 mg/dL (ref 8.7–10.2)
CO2: 23 mmol/L (ref 20–29)
Chloride: 99 mmol/L (ref 96–106)
Creatinine, Ser: 0.77 mg/dL (ref 0.57–1.00)
GFR calc non Af Amer: 88 mL/min/{1.73_m2} (ref >59)
GFR, EST AFRICAN AMERICAN: 102 mL/min/{1.73_m2} (ref >59)
Glucose: 102 mg/dL — ABNORMAL HIGH (ref 65–99)
Potassium: 3.7 mmol/L (ref 3.5–5.2)
Sodium: 138 mmol/L (ref 134–144)

## 2017-12-05 DIAGNOSIS — F17200 Nicotine dependence, unspecified, uncomplicated: Secondary | ICD-10-CM | POA: Insufficient documentation

## 2017-12-05 DIAGNOSIS — K219 Gastro-esophageal reflux disease without esophagitis: Secondary | ICD-10-CM | POA: Insufficient documentation

## 2017-12-13 MED FILL — ?CLONIDINE HCL 0.1 MG TABL: 0.1 | 30 days supply | Qty: 60 | Fill #0

## 2017-12-13 MED FILL — MELOXICAM 15 MG TABLET: 15 | 30 days supply | Qty: 30 | Fill #0

## 2017-12-13 MED FILL — HYDROCHLOROTHIAZIDE 25 MG T: 25 | 30 days supply | Qty: 30 | Fill #0

## 2017-12-13 MED FILL — OMEPRAZOLE DR 40 MG CAPSULE: 40 | 30 days supply | Qty: 30 | Fill #0

## 2018-01-14 MED FILL — HYDROCHLOROTHIAZIDE 25 MG T: 25 | 30 days supply | Qty: 30 | Fill #1

## 2018-01-14 MED FILL — OMEPRAZOLE DR 40 MG CAPSULE: 40 | 30 days supply | Qty: 30 | Fill #1

## 2018-01-14 MED FILL — ?CLONIDINE HCL 0.1 MG TABL: 0.1 | 30 days supply | Qty: 60 | Fill #1

## 2018-01-14 MED FILL — MELOXICAM 15 MG TABLET: 15 | 30 days supply | Qty: 30 | Fill #1

## 2018-01-28 ENCOUNTER — Ambulatory Visit: Payer: No Typology Code available for payment source | Admitting: Family Medicine

## 2018-02-15 MED FILL — HYDROCHLOROTHIAZIDE 25 MG T: 25 | 30 days supply | Qty: 30 | Fill #2

## 2018-02-15 MED FILL — ?CLONIDINE HCL 0.1 MG TABL: 0.1 | 30 days supply | Qty: 60 | Fill #2

## 2018-02-15 MED FILL — MELOXICAM 15 MG TABLET: 15 | 30 days supply | Qty: 30 | Fill #2

## 2018-02-15 MED FILL — OMEPRAZOLE DR 40 MG CAPSULE: 40 | 30 days supply | Qty: 30 | Fill #2

## 2018-03-15 MED FILL — MELOXICAM 15 MG TABLET: 15 | 30 days supply | Qty: 30 | Fill #3

## 2018-03-15 MED FILL — HYDROCHLOROTHIAZIDE 25 MG T: 25 | 30 days supply | Qty: 30 | Fill #3

## 2018-03-15 MED FILL — ?CLONIDINE HCL 0.1 MG TABL: 0.1 | 30 days supply | Qty: 60 | Fill #3

## 2018-03-15 MED FILL — OMEPRAZOLE DR 40 MG CAPSULE: 40 | 30 days supply | Qty: 30 | Fill #3

## 2018-04-12 MED FILL — HYDROCHLOROTHIAZIDE 25 MG T: 25 | 30 days supply | Qty: 30 | Fill #4

## 2018-04-12 MED FILL — ?CYCLOBENZAPRINE 5MG TABLET: 5 | 10 days supply | Qty: 30 | Fill #1

## 2018-04-12 MED FILL — OMEPRAZOLE DR 40 MG CAPSULE: 40 | 30 days supply | Qty: 30 | Fill #4

## 2018-04-12 MED FILL — MELOXICAM 15 MG TABLET: 15 | 30 days supply | Qty: 30 | Fill #4

## 2018-04-12 MED FILL — ?CLONIDINE HCL 0.1 MG TABL: 0.1 | 30 days supply | Qty: 60 | Fill #4

## 2018-05-04 ENCOUNTER — Encounter: Payer: Self-pay | Admitting: Family Medicine

## 2018-05-04 ENCOUNTER — Ambulatory Visit (INDEPENDENT_AMBULATORY_CARE_PROVIDER_SITE_OTHER): Payer: Self-pay | Admitting: Family Medicine

## 2018-05-04 VITALS — BP 136/86 | HR 65 | Temp 98.4°F | Resp 16 | Ht 64.5 in | Wt 165.0 lb

## 2018-05-04 DIAGNOSIS — K219 Gastro-esophageal reflux disease without esophagitis: Secondary | ICD-10-CM

## 2018-05-04 DIAGNOSIS — R42 Dizziness and giddiness: Secondary | ICD-10-CM

## 2018-05-04 DIAGNOSIS — F17200 Nicotine dependence, unspecified, uncomplicated: Secondary | ICD-10-CM

## 2018-05-04 DIAGNOSIS — Z8739 Personal history of other diseases of the musculoskeletal system and connective tissue: Secondary | ICD-10-CM

## 2018-05-04 DIAGNOSIS — H538 Other visual disturbances: Secondary | ICD-10-CM

## 2018-05-04 DIAGNOSIS — E785 Hyperlipidemia, unspecified: Secondary | ICD-10-CM

## 2018-05-04 DIAGNOSIS — I10 Essential (primary) hypertension: Secondary | ICD-10-CM

## 2018-05-04 DIAGNOSIS — Z1159 Encounter for screening for other viral diseases: Secondary | ICD-10-CM

## 2018-05-04 DIAGNOSIS — K029 Dental caries, unspecified: Secondary | ICD-10-CM

## 2018-05-04 DIAGNOSIS — E559 Vitamin D deficiency, unspecified: Secondary | ICD-10-CM

## 2018-05-04 LAB — POCT URINALYSIS DIPSTICK
Bilirubin, UA: NEGATIVE
GLUCOSE UA: NEGATIVE
KETONES UA: NEGATIVE
Leukocytes, UA: NEGATIVE
Nitrite, UA: NEGATIVE
Protein, UA: NEGATIVE
RBC UA: NEGATIVE
SPEC GRAV UA: 1.01 (ref 1.010–1.025)
Urobilinogen, UA: 0.2 E.U./dL
pH, UA: 5 (ref 5.0–8.0)

## 2018-05-04 MED ORDER — MELOXICAM 15 MG PO TABS: 15.0000 mg | ORAL_TABLET | Freq: Every day | ORAL | 5 refills | Status: DC

## 2018-05-04 MED ORDER — OMEPRAZOLE 40 MG PO CPDR: 40.0000 mg | DELAYED_RELEASE_CAPSULE | Freq: Every day | ORAL | 5 refills | Status: DC

## 2018-05-04 MED ORDER — HYDROCHLOROTHIAZIDE 25 MG PO TABS: 25.0000 mg | ORAL_TABLET | Freq: Every day | ORAL | 5 refills | Status: DC

## 2018-05-04 MED ORDER — CLONIDINE HCL 0.1 MG PO TABS: 0.1000 mg | ORAL_TABLET | Freq: Two times a day (BID) | ORAL | 5 refills | Status: DC

## 2018-05-04 NOTE — Patient Instructions (Signed)
[facility] (Breast cancer screening program) [phone]    DASH Eating Plan DASH stands for "Dietary Approaches to Stop Hypertension." The DASH eating plan is a healthy eating plan that has been shown to reduce high blood pressure (hypertension). It may also reduce your risk for type 2 diabetes, heart disease, and stroke. The DASH eating plan may also help with weight loss. What are tips for following this plan? General guidelines  Avoid eating more than 2,300 mg (milligrams) of salt (sodium) a day. If you have hypertension, you may need to reduce your sodium intake to 1,500 mg a day.  Limit alcohol intake to no more than 1 drink a day for nonpregnant women and 2 drinks a day for men. One drink equals 12 oz of beer, 5 oz of wine, or 1 oz of hard liquor.  Work with your health care provider to maintain a healthy body weight or to lose weight. Ask what an ideal weight is for you.  Get at least 30 minutes of exercise that causes your heart to beat faster (aerobic exercise) most days of the week. Activities may include walking, swimming, or biking.  Work with your health care provider or diet and nutrition specialist (dietitian) to adjust your eating plan to your individual calorie needs. Reading food labels  Check food labels for the amount of sodium per serving. Choose foods with less than 5 percent of the Daily Value of sodium. Generally, foods with less than 300 mg of sodium per serving fit into this eating plan.  To find whole grains, look for the word "whole" as the first word in the ingredient list. Shopping  Buy products labeled as "low-sodium" or "no salt added."  Buy fresh foods. Avoid canned foods and premade or frozen meals. Cooking  Avoid adding salt when cooking. Use salt-free seasonings or herbs instead of table salt or sea salt. Check with your health care provider or pharmacist before using salt substitutes.  Do not fry foods. Cook foods using healthy methods such as  baking, boiling, grilling, and broiling instead.  Cook with heart-healthy oils, such as olive, canola, soybean, or sunflower oil. Meal planning   Eat a balanced diet that includes: ? 5 or more servings of fruits and vegetables each day. At each meal, try to fill half of your plate with fruits and vegetables. ? Up to 6-8 servings of whole grains each day. ? Less than 6 oz of lean meat, poultry, or fish each day. A 3-oz serving of meat is about the same size as a deck of cards. One egg equals 1 oz. ? 2 servings of low-fat dairy each day. ? A serving of nuts, seeds, or beans 5 times each week. ? Heart-healthy fats. Healthy fats called Omega-3 fatty acids are found in foods such as flaxseeds and coldwater fish, like sardines, salmon, and mackerel.  Limit how much you eat of the following: ? Canned or prepackaged foods. ? Food that is high in trans fat, such as fried foods. ? Food that is high in saturated fat, such as fatty meat. ? Sweets, desserts, sugary drinks, and other foods with added sugar. ? Full-fat dairy products.  Do not salt foods before eating.  Try to eat at least 2 vegetarian meals each week.  Eat more home-cooked food and less restaurant, buffet, and fast food.  When eating at a restaurant, ask that your food be prepared with less salt or no salt, if possible. What foods are recommended? The items listed may not be  a complete list. Talk with your dietitian about what dietary choices are best for you. Grains Whole-grain or whole-wheat bread. Whole-grain or whole-wheat pasta. Brown rice. [place]. Bulgur. Whole-grain and low-sodium cereals. Pita bread. Low-fat, low-sodium crackers. Whole-wheat flour tortillas. Vegetables Fresh or frozen vegetables (raw, steamed, roasted, or grilled). Low-sodium or reduced-sodium tomato and vegetable juice. Low-sodium or reduced-sodium tomato sauce and tomato paste. Low-sodium or reduced-sodium canned vegetables. Fruits All fresh,  dried, or frozen fruit. Canned fruit in natural juice (without added sugar). Meat and other protein foods Skinless chicken or [place]. Ground chicken or [place]. Pork with fat trimmed off. Fish and seafood. Egg whites. Dried beans, peas, or lentils. Unsalted nuts, nut butters, and seeds. Unsalted canned beans. Lean cuts of beef with fat trimmed off. Low-sodium, lean deli meat. Dairy Low-fat (1%) or fat-free (skim) milk. Fat-free, low-fat, or reduced-fat cheeses. Nonfat, low-sodium ricotta or cottage cheese. Low-fat or nonfat yogurt. Low-fat, low-sodium cheese. Fats and oils Soft margarine without trans fats. Vegetable oil. Low-fat, reduced-fat, or light mayonnaise and salad dressings (reduced-sodium). Canola, safflower, olive, soybean, and sunflower oils. Avocado. Seasoning and other foods Herbs. Spices. Seasoning mixes without salt. Unsalted popcorn and pretzels. Fat-free sweets. What foods are not recommended? The items listed may not be a complete list. Talk with your dietitian about what dietary choices are best for you. Grains Baked goods made with fat, such as croissants, muffins, or some breads. Dry pasta or rice meal packs. Vegetables Creamed or fried vegetables. Vegetables in a cheese sauce. Regular canned vegetables (not low-sodium or reduced-sodium). Regular canned tomato sauce and paste (not low-sodium or reduced-sodium). Regular tomato and vegetable juice (not low-sodium or reduced-sodium). Angie Fava. Olives. Fruits Canned fruit in a light or heavy syrup. Fried fruit. Fruit in cream or butter sauce. Meat and other protein foods Fatty cuts of meat. Ribs. Fried meat. Berniece Salines. Sausage. Bologna and other processed lunch meats. Salami. Fatback. Hotdogs. Bratwurst. Salted nuts and seeds. Canned beans with added salt. Canned or smoked fish. Whole eggs or egg yolks. Chicken or [place] with skin. Dairy Whole or 2% milk, cream, and half-and-half. Whole or full-fat cream cheese. Whole-fat or sweetened  yogurt. Full-fat cheese. Nondairy creamers. Whipped toppings. Processed cheese and cheese spreads. Fats and oils Butter. Stick margarine. Lard. Shortening. Ghee. Bacon fat. Tropical oils, such as coconut, palm kernel, or palm oil. Seasoning and other foods Salted popcorn and pretzels. Onion salt, garlic salt, seasoned salt, table salt, and sea salt. Worcestershire sauce. Tartar sauce. Barbecue sauce. Teriyaki sauce. Soy sauce, including reduced-sodium. Steak sauce. Canned and packaged gravies. Fish sauce. Oyster sauce. Cocktail sauce. Horseradish that you find on the shelf. Ketchup. Mustard. Meat flavorings and tenderizers. Bouillon cubes. Hot sauce and Tabasco sauce. Premade or packaged marinades. Premade or packaged taco seasonings. Relishes. Regular salad dressings. Where to find more information:  National Heart, Lung, and [place]: https://wilson-eaton.com/  American Heart Association: www.heart.org Summary  The DASH eating plan is a healthy eating plan that has been shown to reduce high blood pressure (hypertension). It may also reduce your risk for type 2 diabetes, heart disease, and stroke.  With the DASH eating plan, you should limit salt (sodium) intake to 2,300 mg a day. If you have hypertension, you may need to reduce your sodium intake to 1,500 mg a day.  When on the DASH eating plan, aim to eat more fresh fruits and vegetables, whole grains, lean proteins, low-fat dairy, and heart-healthy fats.  Work with your health care provider or diet and nutrition specialist (  dietitian) to adjust your eating plan to your individual calorie needs. This information is not intended to replace advice given to you by your health care provider. Make sure you discuss any questions you have with your health care provider. Document Released: 10/08/2011 Document Revised: 10/12/2016 Document Reviewed: 10/12/2016 Elsevier Interactive Patient Education  [employer].

## 2018-05-04 NOTE — Progress Notes (Signed)
Subjective:    Patient ID: Nina Clark, female    DOB: 05/24/1966, 52 y.o.   MRN: [id number]  HPI  Yeni Jiggetts, a 52 year old female with a history of hypertension, arthritis,  and GERD presents for a follow up of chronic conditions. She says that she is taking medications consistently.  She is not exercising routinely and is not adherent to low sodium diet. She does not check blood pressures at home. Patient denies chest pain, dyspnea, fatigue, irregular heart beat, lower extremity edema, palpitations, syncope and tachypnea.  Cardiovascular risk factors include: smoking/ tobacco exposure.   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 Patient is not having pain at present.    Paitent has a history of GERD. Symptoms continue to be controlled on Omeprazole 40 mg daily. Symptoms have been associated with fullness after meals and heartburn.  She denies abdominal bloating, belching and eructation, chest pain, choking on food, cough, hematemesis, hoarseness and laryngitis.  She denies dysphagia.  She has not lost weight. She denies melena, hematochezia, hematemesis, and coffee ground emesis.  Past Medical History:  Diagnosis Date  . Hypertension     Social History   Socioeconomic History  . Marital status: Single    Spouse name: Not on file  . Number of children: 1  . Years of education: College  . Highest education level: Not on file  Occupational History    Employer: OTHER    Comment: [employer]  Social Needs  . Financial resource strain: Not on file  . Food insecurity:    Worry: Not on file    Inability: Not on file  . Transportation needs:    Medical: Not on file    Non-medical: Not on file  Tobacco Use  . Smoking status: Current Every Day Smoker    Packs/day: 0.25    Years: 25.00    Pack years: 6.25    Types: Cigarettes  . Smokeless tobacco: Never Used  Substance and Sexual Activity  . Alcohol use: No    Alcohol/week: 0.0 oz  . Drug  use: Yes    Types: Marijuana    Comment: daily  . Sexual activity: Yes    Birth control/protection: None  Lifestyle  . Physical activity:    Days per week: Not on file    Minutes per session: Not on file  . Stress: Not on file  Relationships  . Social connections:    Talks on phone: Not on file    Gets together: Not on file    Attends religious service: Not on file    Active member of club or organization: Not on file    Attends meetings of clubs or organizations: Not on file    Relationship status: Not on file  . Intimate partner violence:    Fear of current or ex partner: Not on file    Emotionally abused: Not on file    Physically abused: Not on file    Forced sexual activity: Not on file  Other Topics Concern  . Not on file  Social History Narrative   Patient lives with family.   Caffeine Use: none   Review of Systems  Constitutional: Negative.  Negative for fatigue.  HENT: Negative.   Eyes: Positive for visual disturbance (blurred vision).  Respiratory: Negative.  Negative for cough, wheezing and stridor.   Cardiovascular: Negative.  Negative for leg swelling.  Gastrointestinal: Negative.   Endocrine: Negative.  Negative for cold intolerance,  heat intolerance, polydipsia, polyphagia and polyuria.  Genitourinary: Negative.  Negative for frequency, pelvic pain and urgency.  Musculoskeletal: Positive for arthralgias.  Skin: Negative.  Negative for pallor and rash.  Allergic/Immunologic: Negative.  Negative for immunocompromised state.  Neurological: Negative for dizziness (history of dizziness), facial asymmetry and weakness.  Hematological: Negative.   Psychiatric/Behavioral: Negative.        Objective:   Physical Exam  Constitutional: She is oriented to person, place, and time. She appears well-developed and well-nourished.  HENT:  Head: Normocephalic and atraumatic.  Right Ear: External ear normal.  Left Ear: External ear normal.  Nose: Nose normal.   Mouth/Throat: Oropharynx is clear and moist.  Eyes: Pupils are equal, round, and reactive to light. Conjunctivae and EOM are normal.  strabismus  Neck: Normal range of motion. Neck supple.  Cardiovascular: Normal rate, regular rhythm, normal heart sounds and intact distal pulses.  Pulmonary/Chest: Effort normal and breath sounds normal.  Abdominal: Soft. Bowel sounds are normal.  Musculoskeletal: Normal range of motion.  Neurological: She is alert and oriented to person, place, and time. She has normal reflexes.  Skin: Skin is warm and dry.  Psychiatric: She has a normal mood and affect. Her behavior is normal. Judgment and thought content normal.      BP 136/86 (BP Location: Left Arm, Patient Position: Sitting, Cuff Size: Normal)   Pulse 65   Temp 98.4 F (36.9 C) (Oral)   Resp 16   Ht 5' 4.5" (1.638 m)   Wt 165 lb (74.8 kg)   SpO2 100%   BMI 27.88 kg/m  Assessment & Plan:  Essential hypertension Blood pressure is at goal on current medication regimen. No medication changes warranted on today.  Patient is asked - Urinalysis Dipstick - Basic Metabolic Panel - hydrochlorothiazide (HYDRODIURIL) 25 MG tablet; Take 1 tablet (25 mg total) by mouth daily.  Dispense: 30 tablet; Refill: 5 - cloNIDine (CATAPRES) 0.1 MG tablet; Take 1 tablet (0.1 mg total) by mouth 2 (two) times daily.  Dispense: 60 tablet; Refill: 5  Blurred vision, bilateral Checked vision on Snellen, 20/40 left eye and 20/25 right eye Patient has not had routine eye exam in 2 years, she wears bifocals.  Recommend that patient follow up    Dental caries - Ambulatory referral to Dentistry  Tobacco dependence Smoking cessation instruction/counseling given:  counseled patient on the dangers of tobacco use, advised patient to stop smoking, and reviewed strategies to maximize success    Dizziness - Orthostatic vital signs - CBC   Hyperlipidemia LDL goal <100 The 10-year ASCVD risk score Denman George DC Jr., et al.,  2013) is: 7.4%   Values used to calculate the score:     Age: 89 years     Sex: Female     Is Non-Hispanic African American: Yes     Diabetic: No     Tobacco smoker: Yes     Systolic Blood Pressure: 136 mmHg     Is BP treated: Yes     HDL Cholesterol: 88 mg/dL     Total Cholesterol: 217 mg/dL - Lipid Panel  Need for hepatitis C screening test - Hepatitis C Antibody   Gastroesophageal reflux disease without esophagitis - omeprazole (PRILOSEC) 40 MG capsule; Take 1 capsule (40 mg total) by mouth daily.  Dispense: 30 capsule; Refill: 5  History of arthritis - meloxicam (MOBIC) 15 MG tablet; Take 1 tablet (15 mg total) by mouth daily.  Dispense: 30 tablet; Refill: 5   Routine Health Maintenance:  Recommend routine screening mammogram   RTC: 6 months for hypertension; will follow-up by phone with laboratory results    Nolon NationsLachina Moore Gordan Grell  MSN, FNP-C Patient Care [facility] [address]  [place], [place] [address] [phone]

## 2018-05-05 LAB — BASIC METABOLIC PANEL
BUN / CREAT RATIO: 16 (ref 9–23)
BUN: 13 mg/dL (ref 6–24)
CO2: 27 mmol/L (ref 20–29)
Calcium: 9.8 mg/dL (ref 8.7–10.2)
Chloride: 98 mmol/L (ref 96–106)
Creatinine, Ser: 0.81 mg/dL (ref 0.57–1.00)
GFR calc Af Amer: 95 mL/min/{1.73_m2} (ref >59)
GFR calc non Af Amer: 83 mL/min/{1.73_m2} (ref >59)
GLUCOSE: 107 mg/dL — AB (ref 65–99)
Potassium: 3.9 mmol/L (ref 3.5–5.2)
Sodium: 139 mmol/L (ref 134–144)

## 2018-05-05 LAB — LIPID PANEL
CHOL/HDL RATIO: 2.6 ratio (ref 0.0–4.4)
Cholesterol, Total: 207 mg/dL — ABNORMAL HIGH (ref 100–199)
HDL: 80 mg/dL (ref >39)
LDL Calculated: 113 mg/dL — ABNORMAL HIGH (ref 0–99)
TRIGLYCERIDES: 68 mg/dL (ref 0–149)
VLDL CHOLESTEROL CAL: 14 mg/dL (ref 5–40)

## 2018-05-05 LAB — HEPATITIS C ANTIBODY: Hep C Virus Ab: <0.1 s/co ratio (ref 0.0–0.9)

## 2018-05-05 LAB — CBC
Hematocrit: 35.3 % (ref 34.0–46.6)
Hemoglobin: 12.3 g/dL (ref 11.1–15.9)
MCH: 30.2 pg (ref 26.6–33.0)
MCHC: 34.8 g/dL (ref 31.5–35.7)
MCV: 87 fL (ref 79–97)
Platelets: 354 10*3/uL (ref 150–450)
RBC: 4.07 x10E6/uL (ref 3.77–5.28)
RDW: 13.1 % (ref 12.3–15.4)
WBC: 5.6 10*3/uL (ref 3.4–10.8)

## 2018-05-06 ENCOUNTER — Other Ambulatory Visit: Payer: Self-pay | Admitting: Family Medicine

## 2018-05-06 DIAGNOSIS — E785 Hyperlipidemia, unspecified: Secondary | ICD-10-CM | POA: Insufficient documentation

## 2018-05-06 MED ORDER — ATORVASTATIN CALCIUM 20 MG PO TABS
20.0000 mg | ORAL_TABLET | Freq: Every day | ORAL | 3 refills | Status: DC
Start: 2018-05-06 — End: 2019-02-01

## 2018-05-06 NOTE — Progress Notes (Signed)
Meds ordered this encounter  Medications  . atorvastatin (LIPITOR) 20 MG tablet    Sig: Take 1 tablet (20 mg total) by mouth daily.    Dispense:  90 tablet    Refill:  3    Kenedie Dirocco Moore Dosia Yodice  MSN, FNP-C Patient Care [facility] [address]  [place], [place] [address] [phone] 

## 2018-05-09 ENCOUNTER — Telehealth: Payer: Self-pay

## 2018-05-09 NOTE — Telephone Encounter (Signed)
-----   Message from Massie MaroonLachina M Hollis, [place] sent at 05/06/2018 11:21 AM EDT ----- Regarding: lab results Please inform patient that cholesterol continues to be elevated, will start a moderate dose statin.  Sent in atorvastatin 20 mg, take every evening with dinner.Recommend a lowfat, low carbohydrate diet divided over 5-6 small meals, increase water intake to 6-8 glasses, and 150 minutes per week of cardiovascular exercise.  All other medications can be continued at current doses.   Nolon NationsLachina Moore Hollis  MSN, FNP-C Patient Care [facility] [address]  [place], [place] [address] [phone]

## 2018-05-09 NOTE — Telephone Encounter (Signed)
Called, no answer. Left a message for patient to call back. Thanks!  

## 2018-05-09 NOTE — Telephone Encounter (Signed)
Patient returned call and I advised that cholesterol continues to be elevated and that we are starting a moderate dose of cholesterol medication called atorvastatin 20mg . Advised patient to take 1 tablet every evening with dinner. Advised to eat a lowfat/ low cholesterol diet over 5 to 6 small meals daily, increase water to 6 to 8 glasses per day, and to exercise 150 minutes weekly of cardio. Patient verbalized understanding. Thanks!

## 2018-05-18 MED FILL — ?CLONIDINE HCL 0.1 MG TABL: 0.1 | 30 days supply | Qty: 60 | Fill #5

## 2018-05-18 MED FILL — MELOXICAM 15 MG TABLET: 15 | 30 days supply | Qty: 30 | Fill #5

## 2018-05-18 MED FILL — ATORVASTATIN 20 MG TABLET: 20 | 30 days supply | Qty: 30 | Fill #0

## 2018-05-18 MED FILL — HYDROCHLOROTHIAZIDE 25 MG T: 25 | 30 days supply | Qty: 30 | Fill #5

## 2018-05-18 MED FILL — OMEPRAZOLE DR 40 MG CAPSULE: 40 | 30 days supply | Qty: 30 | Fill #5

## 2018-05-20 ENCOUNTER — Ambulatory Visit: Payer: No Typology Code available for payment source | Admitting: Family Medicine

## 2018-06-01 ENCOUNTER — Ambulatory Visit: Payer: No Typology Code available for payment source | Admitting: Family Medicine

## 2018-06-15 MED FILL — ATORVASTATIN 20 MG TABLET: 20 | 30 days supply | Qty: 30 | Fill #1

## 2018-06-15 MED FILL — HYDROCHLOROTHIAZIDE 25 MG T: 25 | 30 days supply | Qty: 30 | Fill #0

## 2018-06-15 MED FILL — cloNIDine HCL 0.1 MG TABS: 0.1 | 30 days supply | Qty: 60 | Fill #0

## 2018-06-15 MED FILL — OMEPRAZOLE DR 40 MG CAPSULE: 40 | 30 days supply | Qty: 30 | Fill #0

## 2018-06-15 MED FILL — MELOXICAM 15 MG TABLET: 15 | 30 days supply | Qty: 30 | Fill #0

## 2018-07-13 MED FILL — ATORVASTATIN 20 MG TABLET: 20 | 30 days supply | Qty: 30 | Fill #2

## 2018-07-13 MED FILL — HYDROCHLOROTHIAZIDE 25 MG T: 25 | 30 days supply | Qty: 30 | Fill #1

## 2018-07-13 MED FILL — cloNIDine HCL 0.1 MG TABS: 0.1 | 30 days supply | Qty: 60 | Fill #1

## 2018-07-13 MED FILL — OMEPRAZOLE DR 40 MG CAPSULE: 40 | 30 days supply | Qty: 30 | Fill #1

## 2018-07-13 MED FILL — MELOXICAM 15 MG TABLET: 15 | 30 days supply | Qty: 30 | Fill #1

## 2018-08-10 MED FILL — HYDROCHLOROTHIAZIDE 25 MG T: 25 | 30 days supply | Qty: 30 | Fill #2

## 2018-08-10 MED FILL — OMEPRAZOLE DR 40 MG CAPSULE: 40 | 30 days supply | Qty: 30 | Fill #2

## 2018-08-10 MED FILL — ATORVASTATIN 20 MG TABLET: 20 | 30 days supply | Qty: 30 | Fill #3

## 2018-08-10 MED FILL — cloNIDine HCL 0.1 MG TABS: 0.1 | 30 days supply | Qty: 60 | Fill #2

## 2018-08-10 MED FILL — MELOXICAM 15 MG TABLET: 15 | 30 days supply | Qty: 30 | Fill #2

## 2018-09-07 MED FILL — cloNIDine HCL 0.1 MG TABS: 0.1 | 30 days supply | Qty: 60 | Fill #3

## 2018-09-07 MED FILL — HYDROCHLOROTHIAZIDE 25 MG T: 25 | 30 days supply | Qty: 30 | Fill #3

## 2018-09-07 MED FILL — MELOXICAM 15 MG TABLET: 15 | 30 days supply | Qty: 30 | Fill #3

## 2018-09-07 MED FILL — OMEPRAZOLE DR 40 MG CAPSULE: 40 | 30 days supply | Qty: 30 | Fill #3

## 2018-10-11 MED FILL — HYDROCHLOROTHIAZIDE 25 MG T: 25 | 30 days supply | Qty: 30 | Fill #4

## 2018-10-11 MED FILL — MELOXICAM 15 MG TABLET: 15 | 30 days supply | Qty: 30 | Fill #4

## 2018-10-11 MED FILL — cloNIDine HCL 0.1 MG TABS: 0.1 | 30 days supply | Qty: 60 | Fill #4

## 2018-10-11 MED FILL — OMEPRAZOLE DR 40 MG CAPSULE: 40 | 30 days supply | Qty: 30 | Fill #4

## 2018-11-07 ENCOUNTER — Ambulatory Visit (INDEPENDENT_AMBULATORY_CARE_PROVIDER_SITE_OTHER): Payer: Self-pay | Admitting: Family Medicine

## 2018-11-07 ENCOUNTER — Encounter: Payer: Self-pay | Admitting: Family Medicine

## 2018-11-07 VITALS — BP 133/77 | HR 62 | Temp 98.2°F | Resp 16 | Ht 64.5 in | Wt 168.0 lb

## 2018-11-07 DIAGNOSIS — K219 Gastro-esophageal reflux disease without esophagitis: Secondary | ICD-10-CM

## 2018-11-07 DIAGNOSIS — Z8739 Personal history of other diseases of the musculoskeletal system and connective tissue: Secondary | ICD-10-CM

## 2018-11-07 DIAGNOSIS — M62838 Other muscle spasm: Secondary | ICD-10-CM

## 2018-11-07 DIAGNOSIS — I10 Essential (primary) hypertension: Secondary | ICD-10-CM

## 2018-11-07 LAB — POCT URINALYSIS DIPSTICK
Bilirubin, UA: NEGATIVE
Blood, UA: NEGATIVE
Glucose, UA: NEGATIVE
Ketones, UA: NEGATIVE
Nitrite, UA: NEGATIVE
Protein, UA: NEGATIVE
Spec Grav, UA: <=1.005 — AB (ref 1.010–1.025)
Urobilinogen, UA: 0.2 E.U./dL
pH, UA: 5 (ref 5.0–8.0)

## 2018-11-07 MED ORDER — CLONIDINE HCL 0.1 MG PO TABS: 0.1000 mg | ORAL_TABLET | Freq: Two times a day (BID) | ORAL | 5 refills | Status: DC

## 2018-11-07 MED ORDER — CYCLOBENZAPRINE HCL 5 MG PO TABS: 5.0000 mg | ORAL_TABLET | Freq: Three times a day (TID) | ORAL | 1 refills | Status: DC | PRN

## 2018-11-07 MED ORDER — HYDROCHLOROTHIAZIDE 25 MG PO TABS: 25.0000 mg | ORAL_TABLET | Freq: Every day | ORAL | 5 refills | Status: DC

## 2018-11-07 MED ORDER — OMEPRAZOLE 40 MG PO CPDR: 40.0000 mg | DELAYED_RELEASE_CAPSULE | Freq: Every day | ORAL | 5 refills | Status: DC

## 2018-11-07 MED ORDER — MELOXICAM 15 MG PO TABS: 15.0000 mg | ORAL_TABLET | Freq: Every day | ORAL | 5 refills | Status: DC

## 2018-11-07 MED FILL — OMEPRAZOLE DR 40 MG CAPSULE: 40 | 30 days supply | Qty: 30 | Fill #0

## 2018-11-07 MED FILL — cloNIDine HCL 0.1 MG TABS: 0.1 | 30 days supply | Qty: 60 | Fill #0

## 2018-11-07 MED FILL — HYDROCHLOROTHIAZIDE 25 MG T: 25 | 30 days supply | Qty: 30 | Fill #0

## 2018-11-07 MED FILL — MELOXICAM 15 MG TABLET: 15 | 30 days supply | Qty: 30 | Fill #0

## 2018-11-07 NOTE — Progress Notes (Signed)
[facility]   Progress Note: General Provider: Mike Gip, FNP  SUBJECTIVE:   Nina Clark is a 53 y.o. female who  has a past medical history of Hypertension.. Patient presents today for Hypertension  Patient states that she has not taken Lipitor  in several months. She states that the pharamcy did not have it at the time of pick up and she did not return. Patient states that she is under stress due to her room mate leaving unexpectedly.  Patient denies problems or concerns at the present time. Needs refills.  Review of Systems  Constitutional: Negative.   HENT: Negative.   Eyes: Negative.   Respiratory: Negative.   Cardiovascular: Negative.   Gastrointestinal: Negative.   Genitourinary: Negative.   Musculoskeletal: Negative.   Skin: Negative.   Neurological: Negative.   Psychiatric/Behavioral: Negative.      OBJECTIVE: BP 133/77 (BP Location: Left Arm, Patient Position: Sitting, Cuff Size: Normal)   Pulse 62   Temp 98.2 F (36.8 C) (Oral)   Resp 16   Ht 5' 4.5" (1.638 m)   Wt 168 lb (76.2 kg)   SpO2 100%   BMI 28.39 kg/m   Wt Readings from Last 3 Encounters:  11/07/18 168 lb (76.2 kg)  05/04/18 165 lb (74.8 kg)  12/01/17 161 lb (73 kg)     Physical Exam Vitals signs and nursing note reviewed.  Constitutional:      General: She is not in acute distress.    Appearance: She is well-developed.  HENT:     Head: Normocephalic and atraumatic.  Eyes:     Conjunctiva/sclera: Conjunctivae normal.     Pupils: Pupils are equal, round, and reactive to light.  Neck:     Musculoskeletal: Normal range of motion.  Cardiovascular:     Rate and Rhythm: Normal rate and regular rhythm.     Heart sounds: Normal heart sounds.  Pulmonary:     Effort: Pulmonary effort is normal. No respiratory distress.     Breath sounds: Normal breath sounds.  Abdominal:     General: Bowel sounds are normal. There is no distension.   Palpations: Abdomen is soft.  Musculoskeletal: Normal range of motion.  Skin:    General: Skin is warm and dry.  Neurological:     Mental Status: She is alert and oriented to person, place, and time. Mental status is at baseline.  Psychiatric:        Mood and Affect: Mood normal.        Behavior: Behavior normal.        Thought Content: Thought content normal.        Judgment: Judgment normal.     ASSESSMENT/PLAN:   1. Essential hypertension Pending labs. Will adjust medications accordingly.    - Urinalysis Dipstick - cloNIDine (CATAPRES) 0.1 MG tablet; Take 1 tablet (0.1 mg total) by mouth 2 (two) times daily.  Dispense: 60 tablet; Refill: 5 - hydrochlorothiazide (HYDRODIURIL) 25 MG tablet; Take 1 tablet (25 mg total) by mouth daily.  Dispense: 30 tablet; Refill: 5  2. Muscle spasm Pending labs. Will adjust medications accordingly.    - cyclobenzaprine (FLEXERIL) 5 MG tablet; Take 1 tablet (5 mg total) by mouth 3 (three) times daily as needed for muscle spasms.  Dispense: 30 tablet; Refill: 1  3. History of arthritis   continue present plan and medications.  - meloxicam (MOBIC) 15 MG tablet; Take 1 tablet (15 mg total) by mouth daily.  Dispense:  30 tablet; Refill: 5  4. Gastroesophageal reflux disease without esophagitis  continue present plan and medications. - omeprazole (PRILOSEC) 40 MG capsule; Take 1 capsule (40 mg total) by mouth daily.  Dispense: 30 capsule; Refill: 5    Return in about 6 months (around 05/08/2019) for htn and cholesterol.    The patient was given clear instructions to go to ER or return to medical center if symptoms do not improve, worsen or new problems develop. The patient verbalized understanding and agreed with plan of care.   Ms. Freda Jacksonndr L. Riley Lamouglas, FNP-BC [facility] [address]  [place], [place] [address] [phone]

## 2018-11-07 NOTE — Patient Instructions (Signed)
It was good to meet you. I look forward to working with you. See you in 6 months.   Hypertension Hypertension is another name for high blood pressure. High blood pressure forces your heart to work harder to pump blood. This can cause problems over time. There are two numbers in a blood pressure reading. There is a top number (systolic) over a bottom number (diastolic). It is best to have a blood pressure below 120/80. Healthy choices can help lower your blood pressure. You may need medicine to help lower your blood pressure if:  Your blood pressure cannot be lowered with healthy choices.  Your blood pressure is higher than 130/80. Follow these instructions at home: Eating and drinking   If directed, follow the DASH eating plan. This diet includes: ? Filling half of your plate at each meal with fruits and vegetables. ? Filling one quarter of your plate at each meal with whole grains. Whole grains include whole wheat pasta, brown rice, and whole grain bread. ? Eating or drinking low-fat dairy products, such as skim milk or low-fat yogurt. ? Filling one quarter of your plate at each meal with low-fat (lean) proteins. Low-fat proteins include fish, skinless chicken, eggs, beans, and tofu. ? Avoiding fatty meat, cured and processed meat, or chicken with skin. ? Avoiding premade or processed food.  Eat less than 1,500 mg of salt (sodium) a day.  Limit alcohol use to no more than 1 drink a day for nonpregnant women and 2 drinks a day for men. One drink equals 12 oz of beer, 5 oz of wine, or 1 oz of hard liquor. Lifestyle  Work with your doctor to stay at a healthy weight or to lose weight. Ask your doctor what the best weight is for you.  Get at least 30 minutes of exercise that causes your heart to beat faster (aerobic exercise) most days of the week. This may include walking, swimming, or biking.  Get at least 30 minutes of exercise that strengthens your muscles (resistance exercise) at  least 3 days a week. This may include lifting weights or pilates.  Do not use any products that contain nicotine or tobacco. This includes cigarettes and e-cigarettes. If you need help quitting, ask your doctor.  Check your blood pressure at home as told by your doctor.  Keep all follow-up visits as told by your doctor. This is important. Medicines  Take over-the-counter and prescription medicines only as told by your doctor. Follow directions carefully.  Do not skip doses of blood pressure medicine. The medicine does not work as well if you skip doses. Skipping doses also puts you at risk for problems.  Ask your doctor about side effects or reactions to medicines that you should watch for. Contact a doctor if:  You think you are having a reaction to the medicine you are taking.  You have headaches that keep coming back (recurring).  You feel dizzy.  You have swelling in your ankles.  You have trouble with your vision. Get help right away if:  You get a very bad headache.  You start to feel confused.  You feel weak or numb.  You feel faint.  You get very bad pain in your: ? Chest. ? Belly (abdomen).  You throw up (vomit) more than once.  You have trouble breathing. Summary  Hypertension is another name for high blood pressure.  Making healthy choices can help lower blood pressure. If your blood pressure cannot be controlled with healthy choices, you  may need to take medicine. This information is not intended to replace advice given to you by your health care provider. Make sure you discuss any questions you have with your health care provider. Document Released: 04/06/2008 Document Revised: 09/16/2016 Document Reviewed: 09/16/2016 Elsevier Interactive Patient Education  2019 [employer].

## 2018-11-08 LAB — LIPID PANEL
Chol/HDL Ratio: 3 ratio (ref 0.0–4.4)
Cholesterol, Total: 207 mg/dL — ABNORMAL HIGH (ref 100–199)
HDL: 70 mg/dL (ref >39)
LDL Calculated: 114 mg/dL — ABNORMAL HIGH (ref 0–99)
Triglycerides: 115 mg/dL (ref 0–149)
VLDL Cholesterol Cal: 23 mg/dL (ref 5–40)

## 2018-12-05 ENCOUNTER — Ambulatory Visit (INDEPENDENT_AMBULATORY_CARE_PROVIDER_SITE_OTHER): Payer: Self-pay | Admitting: Family Medicine

## 2018-12-05 ENCOUNTER — Encounter: Payer: Self-pay | Admitting: Family Medicine

## 2018-12-05 VITALS — BP 142/78 | HR 65 | Temp 97.7°F | Resp 16 | Ht 64.5 in | Wt 167.0 lb

## 2018-12-05 DIAGNOSIS — M79672 Pain in left foot: Secondary | ICD-10-CM

## 2018-12-05 MED ORDER — NAPROXEN 500 MG PO TABS: 500.0000 mg | ORAL_TABLET | Freq: Two times a day (BID) | ORAL | 0 refills | Status: DC

## 2018-12-05 MED FILL — NAPROXEN 500 MG TABLET: 500 | 15 days supply | Qty: 30 | Fill #0

## 2018-12-05 NOTE — Progress Notes (Signed)
[facility]   Acute Office Visit  Subjective:    Patient ID: Nina Clark, female    DOB: 05/04/1966, 53 y.o.   MRN: [id number]  Chief Complaint  Patient presents with  . Foot Pain    foot pain/swelling in left  foot     HPI Patient is in today for  Swelling of the left foot off and on for 1 week. Patient states that the area of her foot was swollen and she was unable to bear weight. 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 She states that she wears supportive shoes at work. Denies injury, denies numbness or tingling.   Past Medical History:  Diagnosis Date  . Hypertension     Past Surgical History:  Procedure Laterality Date  . EYE SURGERY      Family History  Problem Relation Age of Onset  . Hypertension Mother   . Hypertension Father   . Diabetes Father   . Diabetes Sister     Social History   Socioeconomic History  . Marital status: Single    Spouse name: Not on file  . Number of children: 1  . Years of education: College  . Highest education level: Not on file  Occupational History    Employer: OTHER    Comment: [employer]  Social Needs  . Financial resource strain: Not on file  . Food insecurity:    Worry: Not on file    Inability: Not on file  . Transportation needs:    Medical: Not on file    Non-medical: Not on file  Tobacco Use  . Smoking status: Current Every Day Smoker    Packs/day: 0.25    Years: 25.00    Pack years: 6.25    Types: Cigarettes  . Smokeless tobacco: Never Used  Substance and Sexual Activity  . Alcohol use: No    Alcohol/week: 0.0 standard drinks  . Drug use: Yes    Types: Marijuana    Comment: daily  . Sexual activity: Yes    Birth control/protection: None  Lifestyle  . Physical activity:    Days per week: Not on file    Minutes per session: Not on file  . Stress: Not on file  Relationships  . Social connections:    Talks on phone: Not on file    Gets  together: Not on file    Attends religious service: Not on file    Active member of club or organization: Not on file    Attends meetings of clubs or organizations: Not on file    Relationship status: Not on file  . Intimate partner violence:    Fear of current or ex partner: Not on file    Emotionally abused: Not on file    Physically abused: Not on file    Forced sexual activity: Not on file  Other Topics Concern  . Not on file  Social History Narrative   Patient lives with family.   Caffeine Use: none    Outpatient Medications Prior to Visit  Medication Sig Dispense Refill  . cloNIDine (CATAPRES) 0.1 MG tablet Take 1 tablet (0.1 mg total) by mouth 2 (two) times daily. 60 tablet 5  . cyclobenzaprine (FLEXERIL) 5 MG tablet Take 1 tablet (5 mg total) by mouth 3 (three) times daily as needed for muscle spasms. 30 tablet 1  . hydrochlorothiazide (HYDRODIURIL) 25 MG tablet Take 1 tablet (25 mg total) by  mouth daily. 30 tablet 5  . meloxicam (MOBIC) 15 MG tablet Take 1 tablet (15 mg total) by mouth daily. 30 tablet 5  . omeprazole (PRILOSEC) 40 MG capsule Take 1 capsule (40 mg total) by mouth daily. 30 capsule 5  . atorvastatin (LIPITOR) 20 MG tablet Take 1 tablet (20 mg total) by mouth daily. (Patient not taking: Reported on 11/07/2018) 90 tablet 3   No facility-administered medications prior to visit.     No Known Allergies  Review of Systems  Constitutional: Negative.   Respiratory: Negative.   Cardiovascular: Negative.   Musculoskeletal: Positive for joint pain (left foot).  Neurological: Negative.   Psychiatric/Behavioral: Negative.        Objective:    Physical Exam  Constitutional: She is oriented to person, place, and time. She appears well-developed and well-nourished. No distress.  HENT:  Head: Normocephalic and atraumatic.  Eyes: Pupils are equal, round, and reactive to light. Conjunctivae are normal.  Cardiovascular: Normal rate, regular rhythm and normal heart  sounds.  Pulmonary/Chest: Effort normal and breath sounds normal. No respiratory distress.  Musculoskeletal: Normal range of motion.       Feet:  Neurological: She is alert and oriented to person, place, and time.  Skin: Skin is warm and dry.  Psychiatric: She has a normal mood and affect. Her behavior is normal. Judgment and thought content normal.  Nursing note and vitals reviewed.   BP (!) 142/78 (BP Location: Left Arm, Patient Position: Sitting, Cuff Size: Large)   Pulse 65   Temp 97.7 F (36.5 C) (Oral)   Resp 16   Ht 5' 4.5" (1.638 m)   Wt 167 lb (75.8 kg)   SpO2 100%   BMI 28.22 kg/m  Wt Readings from Last 3 Encounters:  12/05/18 167 lb (75.8 kg)  11/07/18 168 lb (76.2 kg)  05/04/18 165 lb (74.8 kg)    Health Maintenance Due  Topic Date Due  . MAMMOGRAM  04/25/2017    There are no preventive care reminders to display for this patient.   Lab Results  Component Value Date   TSH 0.54 05/18/2016   Lab Results  Component Value Date   WBC 5.6 05/04/2018   HGB 12.3 05/04/2018   HCT 35.3 05/04/2018   MCV 87 05/04/2018   PLT 354 05/04/2018   Lab Results  Component Value Date   NA 139 05/04/2018   K 3.9 05/04/2018   CO2 27 05/04/2018   GLUCOSE 107 (H) 05/04/2018   BUN 13 05/04/2018   CREATININE 0.81 05/04/2018   BILITOT 0.5 08/09/2017   AST 19 08/09/2017   ALT 14 08/09/2017   PROT 7.3 08/09/2017   CALCIUM 9.8 05/04/2018   ANIONGAP 10 01/30/2016   Lab Results  Component Value Date   CHOL 207 (H) 11/07/2018   Lab Results  Component Value Date   HDL 70 11/07/2018   Lab Results  Component Value Date   LDLCALC 114 (H) 11/07/2018   Lab Results  Component Value Date   TRIG 115 11/07/2018   Lab Results  Component Value Date   CHOLHDL 3.0 11/07/2018   No results found for: HGBA1C     Assessment & Plan:   Problem List Items Addressed This Visit    None    Visit Diagnoses    Left foot pain    -  Primary   Relevant Medications   naproxen  (NAPROSYN) 500 MG tablet       Meds ordered this encounter  Medications  .  naproxen (NAPROSYN) 500 MG tablet    Sig: Take 1 tablet (500 mg total) by mouth 2 (two) times daily with a meal.    Dispense:  30 tablet    Refill:  0     Mike Gip, FNP

## 2018-12-05 NOTE — Patient Instructions (Signed)
Naproxen and naproxen sodium oral immediate-release tablets What is this medicine? NAPROXEN (na PROX en) is a non-steroidal anti-inflammatory drug (NSAID). It is used to reduce swelling and to treat pain. This medicine may be used for dental pain, headache, or painful monthly periods. It is also used for painful joint and muscular problems such as arthritis, tendinitis, bursitis, and gout. This medicine may be used for other purposes; ask your health care provider or pharmacist if you have questions. COMMON BRAND NAME(S): Aflaxen, Aleve, Aleve Arthritis, All Day Relief, Anaprox, Anaprox DS, Naprosyn, [facility] Naproxen Sodium What should I tell my health care provider before I take this medicine? They need to know if you have any of these conditions: -cigarette smoker -coronary artery bypass graft (CABG) surgery within the past 2 weeks -drink more than 3 alcohol-containing drinks a day -heart disease -high blood pressure -history of stomach bleeding -kidney disease -liver disease -lung or breathing disease, like asthma -an unusual or allergic reaction to naproxen, aspirin, other NSAIDs, other medicines, foods, dyes, or preservatives -pregnant or trying to get pregnant -breast-feeding How should I use this medicine? Take this medicine by mouth with a glass of water. Follow the directions on the prescription label. Take it with food if your stomach gets upset. Try to not lie down for at least 10 minutes after you take it. Take your medicine at regular intervals. Do not take your medicine more often than directed. Long-term, continuous use may increase the risk of heart attack or stroke. A special MedGuide will be given to you by the pharmacist with each prescription and refill. Be sure to read this information carefully each time. Talk to your pediatrician regarding the use of this medicine in children. Special care may be needed. Overdosage: If you think you have taken too much of this medicine  contact a poison control center or emergency room at once. NOTE: This medicine is only for you. Do not share this medicine with others. What if I miss a dose? If you miss a dose, take it as soon as you can. If it is almost time for your next dose, take only that dose. Do not take double or extra doses. What may interact with this medicine? -alcohol -aspirin -cidofovir -diuretics -lithium -methotrexate -other drugs for inflammation like ketorolac or prednisone -pemetrexed -probenecid -warfarin This list may not describe all possible interactions. Give your health care provider a list of all the medicines, herbs, non-prescription drugs, or dietary supplements you use. Also tell them if you smoke, drink alcohol, or use illegal drugs. Some items may interact with your medicine. What should I watch for while using this medicine? Tell your doctor or health care professional if your pain does not get better. Talk to your doctor before taking another medicine for pain. Do not treat yourself. This medicine does not prevent heart attack or stroke. In fact, this medicine may increase the chance of a heart attack or stroke. The chance may increase with longer use of this medicine and in people who have heart disease. If you take aspirin to prevent heart attack or stroke, talk with your doctor or health care professional. Do not take other medicines that contain aspirin, ibuprofen, or naproxen with this medicine. Side effects such as stomach upset, nausea, or ulcers may be more likely to occur. Many medicines available without a prescription should not be taken with this medicine. This medicine can cause ulcers and bleeding in the stomach and intestines at any time during treatment. Do not smoke  cigarettes or drink alcohol. These increase irritation to your stomach and can make it more susceptible to damage from this medicine. Ulcers and bleeding can happen without warning symptoms and can cause death. You  may get drowsy or dizzy. Do not drive, use machinery, or do anything that needs mental alertness until you know how this medicine affects you. Do not stand or sit up quickly, especially if you are an older patient. This reduces the risk of dizzy or fainting spells. This medicine can cause you to bleed more easily. Try to avoid damage to your teeth and gums when you brush or floss your teeth. What side effects may I notice from receiving this medicine? Side effects that you should report to your doctor or health care professional as soon as possible: -black or bloody stools, blood in the urine or vomit -blurred vision -chest pain -difficulty breathing or wheezing -nausea or vomiting -severe stomach pain -skin rash, skin redness, blistering or peeling skin, hives, or itching -slurred speech or weakness on one side of the body -swelling of eyelids, throat, lips -unexplained weight gain or swelling -unusually weak or tired -yellowing of eyes or skin Side effects that usually do not require medical attention (report to your doctor or health care professional if they continue or are bothersome): -constipation -headache -heartburn This list may not describe all possible side effects. Call your doctor for medical advice about side effects. You may report side effects to FDA at 1-800-FDA-1088. Where should I keep my medicine? Keep out of the reach of children. Store at room temperature between 15 and 30 degrees C (59 and 86 degrees F). Keep container tightly closed. Throw away any unused medicine after the expiration date. NOTE: This sheet is a summary. It may not cover all possible information. If you have questions about this medicine, talk to your doctor, pharmacist, or health care provider.  2019 Elsevier/Gold Standard (2017-06-24 11:20:43)

## 2018-12-08 MED FILL — HYDROCHLOROTHIAZIDE 25 MG T: 25 | 30 days supply | Qty: 30 | Fill #1

## 2018-12-08 MED FILL — OMEPRAZOLE DR 40 MG CAPSULE: 40 | 30 days supply | Qty: 30 | Fill #1

## 2018-12-08 MED FILL — MELOXICAM 15 MG TABLET: 15 | 30 days supply | Qty: 30 | Fill #1

## 2018-12-08 MED FILL — cloNIDine HCL 0.1 MG TABS: 0.1 | 30 days supply | Qty: 60 | Fill #1

## 2018-12-14 ENCOUNTER — Ambulatory Visit (HOSPITAL_COMMUNITY)
Admission: RE | Admit: 2018-12-14 | Discharge: 2018-12-14 | Disposition: A | Discharge status: Home / Self Care | Payer: No Typology Code available for payment source | Source: Ambulatory Visit | Attending: Family Medicine | Admitting: Family Medicine

## 2018-12-14 ENCOUNTER — Ambulatory Visit (INDEPENDENT_AMBULATORY_CARE_PROVIDER_SITE_OTHER): Payer: Self-pay | Admitting: Family Medicine

## 2018-12-14 ENCOUNTER — Encounter: Payer: Self-pay | Admitting: Family Medicine

## 2018-12-14 VITALS — BP 138/88 | Temp 98.2°F | Resp 16 | Ht 64.5 in | Wt 167.0 lb

## 2018-12-14 DIAGNOSIS — M79672 Pain in left foot: Secondary | ICD-10-CM

## 2018-12-14 MED ORDER — IBUPROFEN 600 MG PO TABS: 600.0000 mg | ORAL_TABLET | Freq: Three times a day (TID) | ORAL | 0 refills | Status: DC | PRN

## 2018-12-14 NOTE — Patient Instructions (Addendum)
Flat Feet, Adult  Normally, a foot has a curve, called an arch, on its inner side. The arch creates a gap between the foot and the ground. Flat feet is a common condition in which one or both feet do not have an arch. What are the causes? This condition may be caused by:  Failure of a normal arch to develop during childhood.  An injury to tendons and ligaments in the foot, such as to the tendon that supports the arch (posterior tibial tendon).  Loose tendons or ligaments in the foot.  A wearing down of the arch over time.  Injury to bones in the foot.  An abnormality in the bones of the foot, called tarsal coalition. This happens when two or more bones in the foot are joined together (fused) before birth. What increases the risk? This condition is more likely to develop in:  Females.  Adults age 55 or older.  People who: ? Have a family history of flat feet. ? Have a history of childhood flexible flatfoot. ? Are obese. ? Have diabetes. ? Have high blood pressure. ? Participate in high-impact sports. ? Have inflammatory arthritis. ? Have a history of broken (fractured) or dislocated bones in the foot. What are the signs or symptoms? Symptoms of this condition include:  Pain or tightness along the bottom of the foot.  Foot pain that gets worse with activity.  Swelling of the inner side of the foot.  Swelling of the ankle.  Pain on the outer side of the ankle.  Changes in the way that you walk (gait).  Pronation. This is when the foot and ankle lean inward when you are standing.  Bony bumps on the top or inner side of the foot. How is this diagnosed? This condition is diagnosed with a physical exam of your foot and ankle. Your health care provider may also:  Look at your shoes for patterns of wear on the soles.  Order imaging tests, such as X-rays, a CT scan, or an MRI.  Refer you to a health care provider who specializes in feet (podiatrist) or a physical  therapist. How is this treated? This condition may be treated with:  Stretching exercises or physical therapy. This helps to increase range of motion and relieve pain.  A shoe insert (orthotic). This helps to support the arch of your foot. Orthotics can be purchased from a store or can be custom-made by your health care provider.  Wearing shoes with appropriate arch support. This is especially important for athletes.  Medicines. These may be prescribed to relieve pain.  An ankle brace, boot, or cast. These may be used to relieve pressure on your foot. You may be given crutches if walking is painful.  Surgery. This may be done to improve the alignment of your foot. This is only needed if your posterior tibial tendon is torn or if you have tarsal coalition. Follow these instructions at home: Activity  Do any exercises as told by your health care provider.  If an activity causes pain, avoid it or try to find another activity that does not cause pain. General instructions  Wear orthotics and appropriate shoes as told by your health care provider.  Take over-the-counter and prescription medicines only as told by your health care provider.  Wear an ankle brace, boot, or cast as told by your health care provider.  Use crutches as told by your health care provider.  Keep all follow-up visits as told by your  health care provider. This is important. How is this prevented? To prevent the condition from getting worse:  Wear comfortable, supportive shoes that are appropriate for your activities.  Maintain a healthy weight.  Stay active in a way that your health care provider recommends. This will help to keep your feet flexible and strong.  Manage long-term (chronic) health conditions, such as diabetes, high blood pressure, and inflammatory arthritis.  Work with a health care provider if you have concerns about your feet or shoes. Contact a health care provider if:  You have pain in  your foot or lower leg that gets worse or does not improve with medicine.  You have pain or difficulty when walking.  You have problems with your orthotics. Summary  Flat feet is a common condition in which one or both feet do not have a curve, called an arch, on the inner side.  Your health care provider may recommend a shoe insert (orthotic) or shoes with the appropriate arch support.  Other treatments may include stretching exercises or physical therapy, medicines to relieve pain, and wearing an ankle brace, boot, or cast.  Surgery may be done if you have a tear in the tendon that supports your arch (posterior tibial tendon) or if two or more of your foot bones were joined together (fused)  before birth (tarsal coalition). This information is not intended to replace advice given to you by your health care provider. Make sure you discuss any questions you have with your health care provider. Document Released: 08/16/2009 Document Revised: 12/30/2016 Document Reviewed: 12/30/2016 Elsevier Interactive Patient Education  2019 [employer]  RICE Therapy for Routine Care of Injuries Many injuries can be cared for with rest, ice, compression, and elevation (RICE therapy). This includes:  Resting the injured part.  Putting ice on the injury.  Putting pressure (compression) on the injury.  Raising the injured part (elevation). Using RICE therapy can help to lessen pain and swelling. Supplies needed:  Ice.  Plastic bag.  Towel.  Elastic bandage.  Pillow or pillows to raise (elevate) your injured body part. How to care for your injury with RICE therapy Rest Limit your normal activities, and try not to use the injured part of your body. You can go back to your normal activities when your doctor says it is okay to do them and you feel okay. Ask your doctor if you should do exercises to help your injury get better. Ice Put ice on the injured area. Do not put ice on your bare  skin.  Put ice in a plastic bag.  Place a towel between your skin and the bag.  Leave the ice on for 20 minutes, 2-3 times a day. Use ice on as many days as told by your doctor.  Compression Compression means putting pressure on the injured area. This can be done with an elastic bandage. If an elastic bandage has been put on your injury:  Do not wrap the bandage too tight. Wrap the bandage more loosely if part of your body away from the bandage is blue, swollen, cold, painful, or loses feeling (gets numb).  Take off the bandage and put it on again. Do this every 3-4 hours or as told by your doctor.  See your doctor if the bandage seems to make your problems worse.  Elevation Elevation means keeping the injured area raised. If you can, raise the injured area above your heart or the center of your chest. Contact a doctor if:  You  keep having pain and swelling.  Your symptoms get worse. Get help right away if:  You have sudden bad pain at your injury or lower than your injury.  You have redness or more swelling around your injury.  You have tingling or numbness at your injury or lower than your injury, and it does not go away when you take off the bandage. Summary  Many injuries can be cared for using rest, ice, compression, and elevation (RICE therapy).  You can go back to your normal activities when you feel okay and your doctor says it is okay.  Put ice on the injured area as told by your doctor.  Get help if your symptoms get worse or if you keep having pain and swelling. This information is not intended to replace advice given to you by your health care provider. Make sure you discuss any questions you have with your health care provider. Document Released: 04/06/2008 Document Revised: 07/09/2017 Document Reviewed: 07/09/2017 Elsevier Interactive Patient Education  2019 [employer] Inc. Ibuprofen tablets and capsules What is this medicine? IBUPROFEN (eye BYOO proe fen) is  a non-steroidal anti-inflammatory drug (NSAID). It is used for dental pain, fever, headaches or migraines, osteoarthritis, rheumatoid arthritis, or painful monthly periods. It can also relieve minor aches and pains caused by a cold, flu, or sore throat. This medicine may be used for other purposes; ask your health care provider or pharmacist if you have questions. COMMON BRAND NAME(S): Advil, Advil Junior Strength, Advil Migraine, Genpril, Ibren, IBU, Midol, Midol Cramps and Body Aches, Motrin, Motrin IB, Motrin Junior Strength, Motrin Migraine Pain, Samson-8, Toxicology Saliva Collection What should I tell my health care provider before I take this medicine? They need to know if you have any of these conditions: -cigarette smoker -coronary artery bypass graft (CABG) surgery within the past 2 weeks -drink more than 3 alcohol-containing drinks a day -heart disease -high blood pressure -history of stomach bleeding -kidney disease -liver disease -lung or breathing disease, like asthma -an unusual or allergic reaction to ibuprofen, aspirin, other NSAIDs, other medicines, foods, dyes, or preservatives -pregnant or trying to get pregnant -breast-feeding How should I use this medicine? Take this medicine by mouth with a glass of water. Follow the directions on the prescription label. Take this medicine with food if your stomach gets upset. Try to not lie down for at least 10 minutes after you take the medicine. Take your medicine at regular intervals. Do not take your medicine more often than directed. A special MedGuide will be given to you by the pharmacist with each prescription and refill. Be sure to read this information carefully each time. Talk to your pediatrician regarding the use of this medicine in children. Special care may be needed. Overdosage: If you think you have taken too much of this medicine contact a poison control center or emergency room at once. NOTE: This medicine is only for  you. Do not share this medicine with others. What if I miss a dose? If you miss a dose, take it as soon as you can. If it is almost time for your next dose, take only that dose. Do not take double or extra doses. What may interact with this medicine? Do not take this medicine with any of the following medications: -cidofovir -ketorolac -methotrexate -pemetrexed This medicine may also interact with the following medications: -alcohol -aspirin -diuretics -lithium -other drugs for inflammation like prednisone -warfarin This list may not describe all possible interactions. Give your health care provider a  list of all the medicines, herbs, non-prescription drugs, or dietary supplements you use. Also tell them if you smoke, drink alcohol, or use illegal drugs. Some items may interact with your medicine. What should I watch for while using this medicine? Tell your doctor or healthcare professional if your symptoms do not start to get better or if they get worse. This medicine does not prevent heart attack or stroke. In fact, this medicine may increase the chance of a heart attack or stroke. The chance may increase with longer use of this medicine and in people who have heart disease. If you take aspirin to prevent heart attack or stroke, talk with your doctor or health care professional. Do not take other medicines that contain aspirin, ibuprofen, or naproxen with this medicine. Side effects such as stomach upset, nausea, or ulcers may be more likely to occur. Many medicines available without a prescription should not be taken with this medicine. This medicine can cause ulcers and bleeding in the stomach and intestines at any time during treatment. Ulcers and bleeding can happen without warning symptoms and can cause death. To reduce your risk, do not smoke cigarettes or drink alcohol while you are taking this medicine. You may get drowsy or dizzy. Do not drive, use machinery, or do anything that  needs mental alertness until you know how this medicine affects you. Do not stand or sit up quickly, especially if you are an older patient. This reduces the risk of dizzy or fainting spells. This medicine can cause you to bleed more easily. Try to avoid damage to your teeth and gums when you brush or floss your teeth. This medicine may be used to treat migraines. If you take migraine medicines for 10 or more days a month, your migraines may get worse. Keep a diary of headache days and medicine use. Contact your healthcare professional if your migraine attacks occur more frequently. What side effects may I notice from receiving this medicine? Side effects that you should report to your doctor or health care professional as soon as possible: -allergic reactions like skin rash, itching or hives, swelling of the face, lips, or tongue -severe stomach pain -signs and symptoms of bleeding such as bloody or black, tarry stools; red or dark-brown urine; spitting up blood or brown material that looks like coffee grounds; red spots on the skin; unusual bruising or bleeding from the eye, gums, or nose -signs and symptoms of a blood clot such as changes in vision; chest pain; severe, sudden headache; trouble speaking; sudden numbness or weakness of the face, arm, or leg -unexplained weight gain or swelling -unusually weak or tired -yellowing of eyes or skin Side effects that usually do not require medical attention (report to your doctor or health care professional if they continue or are bothersome): -bruising -diarrhea -dizziness, drowsiness -headache -nausea, vomiting This list may not describe all possible side effects. Call your doctor for medical advice about side effects. You may report side effects to FDA at 1-800-FDA-1088. Where should I keep my medicine? Keep out of the reach of children. Store at room temperature between 15 and 30 degrees C (59 and 86 degrees F). Keep container tightly closed.  Throw away any unused medicine after the expiration date. NOTE: This sheet is a summary. It may not cover all possible information. If you have questions about this medicine, talk to your doctor, pharmacist, or health care provider.  2019 Elsevier/Gold Standard (2017-06-23 12:43:57)

## 2018-12-14 NOTE — Progress Notes (Signed)
  [facility]   Progress Note: Sick visit Provider: Mike Gip, FNP  SUBJECTIVE:   Nina Clark is a 53 y.o. female who  has a past medical history of Hypertension.. Patient presents today for Foot Pain (left foot arch pain and swelling )   Patient presents for follow-up appointment regarding her left foot arch pain and swelling.  At the last visit, she was given naproxen.  She states that this is not relieving the pain.  Patient also reports doing insoles in her shoes.  She works at a job that requires a lot of standing and walking.  She states that she missed work last week due to this pain.  Review of Systems  Constitutional: Negative.   Musculoskeletal: Positive for joint pain (foot).  Neurological: Negative.   Psychiatric/Behavioral: Negative.      OBJECTIVE: BP 138/88 (BP Location: Right Arm, Patient Position: Sitting, Cuff Size: Normal)   Temp 98.2 F (36.8 C) (Oral)   Resp 16   Ht 5' 4.5" (1.638 m)   Wt 167 lb (75.8 kg)   SpO2 100%   BMI 28.22 kg/m   Wt Readings from Last 3 Encounters:  12/14/18 167 lb (75.8 kg)  12/05/18 167 lb (75.8 kg)  11/07/18 168 lb (76.2 kg)     Physical Exam Vitals signs and nursing note reviewed.  Constitutional:      General: She is not in acute distress.    Appearance: Normal appearance.  HENT:     Head: Normocephalic and atraumatic.  Musculoskeletal:       Feet:  Feet:     Left foot:     Skin integrity: Skin integrity normal.  Neurological:     Mental Status: She is alert.     ASSESSMENT/PLAN:  1. Left foot pain Ibuprofen 600 TID. D/c naproxen and mobic - DG Foot Complete Left; Future - Uric Acid Suspect falling arch.   Return if symptoms worsen or fail to improve.    The patient was given clear instructions to go to ER or return to medical center if symptoms do not improve, worsen or new problems develop. The patient verbalized understanding and agreed with plan of  care.   Ms. [name]. Riley Lam, FNP-BC [facility] [address] [place], [place] [address] [phone]

## 2018-12-15 LAB — URIC ACID: Uric Acid: 4.7 mg/dL (ref 2.5–7.1)

## 2018-12-16 ENCOUNTER — Telehealth: Payer: Self-pay

## 2018-12-16 NOTE — Telephone Encounter (Signed)
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 There is also a heel spur, a build up of bone. Continue with current medications. ----- Message ----- From: Interface, Rad Results In Sent: 12/14/2018   3:33 PM EST To: Mike Gip, FNP

## 2018-12-16 NOTE — Telephone Encounter (Signed)
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 Also advised that there is a heel spur present. Asked that she continue current medications. Patient verbalized understanding. Thanks!

## 2018-12-21 ENCOUNTER — Telehealth: Payer: Self-pay

## 2018-12-21 NOTE — Telephone Encounter (Signed)
-----   Message from Mike Gip, FNP sent at 12/19/2018 10:50 PM EST ----- No gout noted in the blood work. Continue with current medications

## 2018-12-21 NOTE — Telephone Encounter (Signed)
Called and spoke with patient, advised that no gout was noted in blood work and to continue current medications. Thanks!

## 2019-01-09 MED FILL — OMEPRAZOLE DR 40 MG CAPSULE: 40 | 30 days supply | Qty: 30 | Fill #2

## 2019-01-09 MED FILL — MELOXICAM 15 MG TABLET: 15 | 30 days supply | Qty: 30 | Fill #2

## 2019-01-09 MED FILL — HYDROCHLOROTHIAZIDE 25 MG T: 25 | 30 days supply | Qty: 30 | Fill #2

## 2019-01-09 MED FILL — cloNIDine HCL 0.1 MG TABS: 0.1 | 30 days supply | Qty: 60 | Fill #2

## 2019-01-26 ENCOUNTER — Telehealth: Payer: Self-pay

## 2019-01-26 DIAGNOSIS — M79672 Pain in left foot: Secondary | ICD-10-CM

## 2019-01-26 MED ORDER — NAPROXEN 500 MG PO TABS: 500.0000 mg | ORAL_TABLET | Freq: Two times a day (BID) | ORAL | 0 refills | Status: DC

## 2019-01-26 NOTE — Telephone Encounter (Signed)
Medication sent to [facility]

## 2019-02-01 ENCOUNTER — Ambulatory Visit (INDEPENDENT_AMBULATORY_CARE_PROVIDER_SITE_OTHER): Payer: Self-pay | Admitting: Family Medicine

## 2019-02-01 ENCOUNTER — Other Ambulatory Visit: Payer: Self-pay

## 2019-02-01 DIAGNOSIS — M79672 Pain in left foot: Secondary | ICD-10-CM

## 2019-02-01 NOTE — Progress Notes (Signed)
  [facility]  Virtual Visit via Telephone Note  I connected with Nina Clark on 02/01/19 at 11:20 AM EDT by telephone and verified that I am speaking with the correct person using two identifiers.   I discussed the limitations, risks, security and privacy concerns of performing an evaluation and management service by telephone and the availability of in person appointments. I also discussed with the patient that there may be a patient responsible charge related to this service. The patient expressed understanding and agreed to proceed.   History of Present Illness: Patient states that she continues to have left foot pain and has now noticed a "bone" coming from the bottom. She states that she has been taking naproxen with slight relief. She would like a referral to podiatry. However, she states that she does not have insurance and the "orange card is a waste of time".     Observations/Objective: Patient with regular voice tone, rate and rhythm. Speaking calmly and is in no apparent distress.    Assessment and Plan: 1. Left foot pain Informed patient to apply for [place] discount and orange card in order to be referred to a specialist. Patient states    Follow Up Instructions:    I discussed the assessment and treatment plan with the patient. The patient was provided an opportunity to ask questions and all were answered. The patient agreed with the plan and demonstrated an understanding of the instructions.   The patient was advised to call back or seek an in-person evaluation if the symptoms worsen or if the condition fails to improve as anticipated.  I provided 20 minutes of non-face-to-face time during this encounter.  Ms. Andr L. Riley Lam, FNP-BC [facility] [address] [place], [place] [address] [phone]

## 2019-02-02 MED FILL — cloNIDine HCL 0.1 MG TABS: 0.1 | 30 days supply | Qty: 60 | Fill #3

## 2019-02-02 MED FILL — OMEPRAZOLE DR 40 MG CAPSULE: 40 | 30 days supply | Qty: 30 | Fill #3

## 2019-02-02 MED FILL — CYCLOBENZAPRINE 5 MG TABLET: 5 | 10 days supply | Qty: 30 | Fill #0

## 2019-02-02 MED FILL — HYDROCHLOROTHIAZIDE 25 MG T: 25 | 30 days supply | Qty: 30 | Fill #3

## 2019-02-03 ENCOUNTER — Other Ambulatory Visit: Payer: Self-pay | Admitting: Family Medicine

## 2019-02-03 DIAGNOSIS — M79672 Pain in left foot: Secondary | ICD-10-CM

## 2019-02-03 MED ORDER — MELOXICAM 15 MG PO TABS: 15.0000 mg | ORAL_TABLET | Freq: Every day | ORAL | 0 refills | Status: DC

## 2019-02-03 MED FILL — MELOXICAM 15 MG TABLET: 15 | 30 days supply | Qty: 30 | Fill #0

## 2019-02-03 NOTE — Progress Notes (Signed)
Patient called and requested meloxicam instead of naproxen. Will send in script.

## 2019-02-28 NOTE — Telephone Encounter (Signed)
Message sent to provider 

## 2019-03-09 MED FILL — HYDROCHLOROTHIAZIDE 25 MG T: 25 | 30 days supply | Qty: 30 | Fill #4

## 2019-03-09 MED FILL — MELOXICAM 15 MG TABLET: 15 | 30 days supply | Qty: 30 | Fill #5

## 2019-03-09 MED FILL — ?CLONIDINE HCL 0.1 MG TABL: 0.1 | 30 days supply | Qty: 60 | Fill #4

## 2019-03-09 MED FILL — ?OMEPRAZOLE 20 MG CAPSULE D: 20 | 30 days supply | Qty: 60 | Fill #0

## 2019-04-04 MED FILL — ?CLONIDINE HCL 0.1 MG TABL: 0.1 | 30 days supply | Qty: 60 | Fill #5

## 2019-04-04 MED FILL — ?HYDROCHLOROTHIAZIDE 25MG T: 25 | 30 days supply | Qty: 30 | Fill #5

## 2019-04-05 ENCOUNTER — Other Ambulatory Visit: Payer: Self-pay

## 2019-04-05 DIAGNOSIS — M79672 Pain in left foot: Secondary | ICD-10-CM

## 2019-04-05 DIAGNOSIS — K219 Gastro-esophageal reflux disease without esophagitis: Secondary | ICD-10-CM

## 2019-04-05 MED ORDER — MELOXICAM 15 MG PO TABS: 15.0000 mg | ORAL_TABLET | Freq: Every day | ORAL | 0 refills | Status: DC

## 2019-04-05 MED ORDER — OMEPRAZOLE 40 MG PO CPDR: 40.0000 mg | DELAYED_RELEASE_CAPSULE | Freq: Every day | ORAL | 5 refills | Status: DC

## 2019-04-05 MED FILL — ?OMEPRAZOLE 20 MG CAPSULE D: 20 | 30 days supply | Qty: 60 | Fill #0

## 2019-04-05 MED FILL — MELOXICAM 15 MG TABLET: 15 | 30 days supply | Qty: 30 | Fill #0

## 2019-05-08 ENCOUNTER — Encounter: Payer: Self-pay | Admitting: Family Medicine

## 2019-05-08 ENCOUNTER — Other Ambulatory Visit: Payer: Self-pay

## 2019-05-08 ENCOUNTER — Ambulatory Visit (INDEPENDENT_AMBULATORY_CARE_PROVIDER_SITE_OTHER): Payer: Self-pay | Admitting: Family Medicine

## 2019-05-08 VITALS — BP 116/79 | HR 97 | Temp 98.6°F | Resp 16 | Ht 67.5 in | Wt 167.2 lb

## 2019-05-08 DIAGNOSIS — I10 Essential (primary) hypertension: Secondary | ICD-10-CM

## 2019-05-08 DIAGNOSIS — M62838 Other muscle spasm: Secondary | ICD-10-CM

## 2019-05-08 DIAGNOSIS — K219 Gastro-esophageal reflux disease without esophagitis: Secondary | ICD-10-CM

## 2019-05-08 DIAGNOSIS — M79672 Pain in left foot: Secondary | ICD-10-CM

## 2019-05-08 LAB — POCT URINALYSIS DIP (CLINITEK)
Bilirubin, UA: NEGATIVE
Blood, UA: NEGATIVE
Glucose, UA: NEGATIVE mg/dL
Ketones, POC UA: NEGATIVE mg/dL
Leukocytes, UA: NEGATIVE
Nitrite, UA: NEGATIVE
POC PROTEIN,UA: NEGATIVE
Spec Grav, UA: 1.015 (ref 1.010–1.025)
Urobilinogen, UA: 0.2 E.U./dL
pH, UA: 5 (ref 5.0–8.0)

## 2019-05-08 MED ORDER — OMEPRAZOLE 40 MG PO CPDR: 40.0000 mg | DELAYED_RELEASE_CAPSULE | Freq: Every day | ORAL | 5 refills | Status: DC

## 2019-05-08 MED ORDER — HYDROCHLOROTHIAZIDE 25 MG PO TABS: 25.0000 mg | ORAL_TABLET | Freq: Every day | ORAL | 5 refills | Status: DC

## 2019-05-08 MED ORDER — MELOXICAM 15 MG PO TABS: 15.0000 mg | ORAL_TABLET | Freq: Every day | ORAL | 2 refills | Status: DC

## 2019-05-08 MED ORDER — CYCLOBENZAPRINE HCL 5 MG PO TABS: 5.0000 mg | ORAL_TABLET | Freq: Three times a day (TID) | ORAL | 1 refills | Status: DC | PRN

## 2019-05-08 MED ORDER — CLONIDINE HCL 0.1 MG PO TABS: 0.1000 mg | ORAL_TABLET | Freq: Two times a day (BID) | ORAL | 5 refills | Status: DC

## 2019-05-08 MED FILL — cloNIDine HCL 0.1 MG TABS: 0.1 | 30 days supply | Qty: 60 | Fill #0

## 2019-05-08 MED FILL — CYCLOBENZAPRINE 5 MG TABLET: 5 | 10 days supply | Qty: 30 | Fill #0

## 2019-05-08 MED FILL — OMEPRAZOLE DR 40 MG CAPSULE: 40 | 30 days supply | Qty: 30 | Fill #0

## 2019-05-08 MED FILL — ?HYDROCHLOROTHIAZIDE 25MG T: 25 | 30 days supply | Qty: 30 | Fill #0

## 2019-05-08 MED FILL — MELOXICAM 15 MG TABLET: 15 | 30 days supply | Qty: 30 | Fill #0

## 2019-05-08 NOTE — Patient Instructions (Signed)
Hypertension, Adult Hypertension is another name for high blood pressure. High blood pressure forces your heart to work harder to pump blood. This can cause problems over time. There are two numbers in a blood pressure reading. There is a top number (systolic) over a bottom number (diastolic). It is best to have a blood pressure that is below 120/80. Healthy choices can help lower your blood pressure, or you may need medicine to help lower it. What are the causes? The cause of this condition is not known. Some conditions may be related to high blood pressure. What increases the risk?  Smoking.  Having type 2 diabetes mellitus, high cholesterol, or both.  Not getting enough exercise or physical activity.  Being overweight.  Having too much fat, sugar, calories, or salt (sodium) in your diet.  Drinking too much alcohol.  Having long-term (chronic) kidney disease.  Having a family history of high blood pressure.  Age. Risk increases with age.  Race. You may be at higher risk if you are African American.  Gender. Men are at higher risk than women before age 66. After age 7, women are at higher risk than men.  Having obstructive sleep apnea.  Stress. What are the signs or symptoms?  High blood pressure may not cause symptoms. Very high blood pressure (hypertensive crisis) may cause: ? Headache. ? Feelings of worry or nervousness (anxiety). ? Shortness of breath. ? Nosebleed. ? A feeling of being sick to your stomach (nausea). ? Throwing up (vomiting). ? Changes in how you see. ? Very bad chest pain. ? Seizures. How is this treated?  This condition is treated by making healthy lifestyle changes, such as: ? Eating healthy foods. ? Exercising more. ? Drinking less alcohol.  Your health care provider may prescribe medicine if lifestyle changes are not enough to get your blood pressure under control, and if: ? Your top number is above 130. ? Your bottom number is above  80.  Your personal target blood pressure may vary. Follow these instructions at home: Eating and drinking   If told, follow the DASH eating plan. To follow this plan: ? Fill one half of your plate at each meal with fruits and vegetables. ? Fill one fourth of your plate at each meal with whole grains. Whole grains include whole-wheat pasta, brown rice, and whole-grain bread. ? Eat or drink low-fat dairy products, such as skim milk or low-fat yogurt. ? Fill one fourth of your plate at each meal with low-fat (lean) proteins. Low-fat proteins include fish, chicken without skin, eggs, beans, and tofu. ? Avoid fatty meat, cured and processed meat, or chicken with skin. ? Avoid pre-made or processed food.  Eat less than 1,500 mg of salt each day.  Do not drink alcohol if: ? Your doctor tells you not to drink. ? You are pregnant, may be pregnant, or are planning to become pregnant.  If you drink alcohol: ? Limit how much you use to:  0-1 drink a day for women.  0-2 drinks a day for men. ? Be aware of how much alcohol is in your drink. In the [place] one drink equals one 12 oz bottle of beer (355 mL), one 5 oz glass of wine (148 mL), or one 1 oz glass of hard liquor (44 mL). Lifestyle   Work with your doctor to stay at a healthy weight or to lose weight. Ask your doctor what the best weight is for you.  Get at least 30 minutes of exercise  most days of the week. This may include walking, swimming, or biking.  Get at least 30 minutes of exercise that strengthens your muscles (resistance exercise) at least 3 days a week. This may include lifting weights or doing Pilates.  Do not use any products that contain nicotine or tobacco, such as cigarettes, e-cigarettes, and chewing tobacco. If you need help quitting, ask your doctor.  Check your blood pressure at home as told by your doctor.  Keep all follow-up visits as told by your doctor. This is important. Medicines  Take over-the-counter  and prescription medicines only as told by your doctor. Follow directions carefully.  Do not skip doses of blood pressure medicine. The medicine does not work as well if you skip doses. Skipping doses also puts you at risk for problems.  Ask your doctor about side effects or reactions to medicines that you should watch for. Contact a doctor if you:  Think you are having a reaction to the medicine you are taking.  Have headaches that keep coming back (recurring).  Feel dizzy.  Have swelling in your ankles.  Have trouble with your vision. Get help right away if you:  Get a very bad headache.  Start to feel mixed up (confused).  Feel weak or numb.  Feel faint.  Have very bad pain in your: ? Chest. ? Belly (abdomen).  Throw up more than once.  Have trouble breathing. Summary  Hypertension is another name for high blood pressure.  High blood pressure forces your heart to work harder to pump blood.  For most people, a normal blood pressure is less than 120/80.  Making healthy choices can help lower blood pressure. If your blood pressure does not get lower with healthy choices, you may need to take medicine. This information is not intended to replace advice given to you by your health care provider. Make sure you discuss any questions you have with your health care provider. Document Released: 04/06/2008 Document Revised: 06/29/2018 Document Reviewed: 06/29/2018 Elsevier Patient Education  2020 [employer]     Why follow it? Research shows. . Those who follow the Mediterranean diet have a reduced risk of heart disease  . The diet is associated with a reduced incidence of Parkinson's and Alzheimer's diseases . People following the diet may have longer life expectancies and lower rates of chronic diseases  . The Dietary Guidelines for Americans recommends the Mediterranean diet as an eating plan to promote health and prevent disease  What Is the Mediterranean Diet?  .  Healthy eating plan based on typical foods and recipes of Mediterranean-style cooking . The diet is primarily a plant based diet; these foods should make up a majority of meals   Starches - Plant based foods should make up a majority of meals - They are an important sources of vitamins, minerals, energy, antioxidants, and fiber - Choose whole grains, foods high in fiber and minimally processed items  - Typical grain sources include wheat, oats, barley, corn, brown rice, bulgar, farro, millet, polenta, couscous  - Various types of beans include chickpeas, lentils, fava beans, black beans, white beans   Fruits  Veggies - Large quantities of antioxidant rich fruits & veggies; 6 or more servings  - Vegetables can be eaten raw or lightly drizzled with oil and cooked  - Vegetables common to the traditional Mediterranean Diet include: artichokes, arugula, beets, broccoli, brussel sprouts, cabbage, carrots, celery, collard greens, cucumbers, eggplant, kale, leeks, lemons, lettuce, mushrooms, okra, onions, peas, peppers, potatoes, pumpkin,  radishes, rutabaga, shallots, spinach, sweet potatoes, turnips, zucchini - Fruits common to the Mediterranean Diet include: apples, apricots, avocados, cherries, clementines, dates, figs, grapefruits, grapes, melons, nectarines, oranges, peaches, pears, pomegranates, strawberries, tangerines  Fats - Replace butter and margarine with healthy oils, such as olive oil, canola oil, and tahini  - Limit nuts to no more than a handful a day  - Nuts include walnuts, almonds, pecans, pistachios, pine nuts  - Limit or avoid candied, honey roasted or heavily salted nuts - Olives are central to the [employer] - can be eaten whole or used in a variety of dishes   Meats Protein - Limiting red meat: no more than a few times a month - When eating red meat: choose lean cuts and keep the portion to the size of deck of cards - Eggs: approx. 0 to 4 times a week  - Fish and lean  poultry: at least 2 a week  - Healthy protein sources include, chicken, [place], lean beef, lamb - Increase intake of seafood such as tuna, salmon, trout, mackerel, shrimp, scallops - Avoid or limit high fat processed meats such as sausage and bacon  Dairy - Include moderate amounts of low fat dairy products  - Focus on healthy dairy such as fat free yogurt, skim milk, low or reduced fat cheese - Limit dairy products higher in fat such as whole or 2% milk, cheese, ice cream  Alcohol - Moderate amounts of red wine is ok  - No more than 5 oz daily for women (all ages) and men older than age 32  - No more than 10 oz of wine daily for men younger than 5  Other - Limit sweets and other desserts  - Use herbs and spices instead of salt to flavor foods  - Herbs and spices common to the traditional Mediterranean Diet include: basil, bay leaves, chives, cloves, cumin, fennel, garlic, lavender, marjoram, mint, oregano, parsley, pepper, rosemary, sage, savory, sumac, tarragon, thyme   It's not just a diet, it's a lifestyle:  . The Mediterranean diet includes lifestyle factors typical of those in the region  . Foods, drinks and meals are best eaten with others and savored . Daily physical activity is important for overall good health . This could be strenuous exercise like running and aerobics . This could also be more leisurely activities such as walking, housework, yard-work, or taking the stairs . Moderation is the key; a balanced and healthy diet accommodates most foods and drinks . Consider portion sizes and frequency of consumption of certain foods   Meal Ideas & Options:  . Breakfast:  o Whole wheat toast or whole wheat English muffins with peanut butter & hard boiled egg o Steel cut oats topped with apples & cinnamon and skim milk  o Fresh fruit: banana, strawberries, melon, berries, peaches  o Smoothies: strawberries, bananas, greek yogurt, peanut butter o Low fat greek yogurt with  blueberries and granola  o Egg white omelet with spinach and mushrooms o Breakfast couscous: whole wheat couscous, apricots, skim milk, cranberries  . Sandwiches:  o Hummus and grilled vegetables (peppers, zucchini, squash) on whole wheat bread   o Grilled chicken on whole wheat pita with lettuce, tomatoes, cucumbers or tzatziki  o Tuna salad on whole wheat bread: tuna salad made with greek yogurt, olives, red peppers, capers, green onions o Garlic rosemary lamb pita: lamb sauted with garlic, rosemary, salt & pepper; add lettuce, cucumber, greek yogurt to pita - flavor with lemon juice and  black pepper  . Seafood:  o Mediterranean grilled salmon, seasoned with garlic, basil, parsley, lemon juice and black pepper o Shrimp, lemon, and spinach whole-grain pasta salad made with low fat greek yogurt  o Seared scallops with lemon orzo  o Seared tuna steaks seasoned salt, pepper, coriander topped with tomato mixture of olives, tomatoes, olive oil, minced garlic, parsley, green onions and cappers  . Meats:  o Herbed greek chicken salad with kalamata olives, cucumber, feta  o Red bell peppers stuffed with spinach, bulgur, lean ground beef (or lentils) & topped with feta   o Kebabs: skewers of chicken, tomatoes, onions, zucchini, squash  o [place]key burgers: made with red onions, mint, dill, lemon juice, feta cheese topped with roasted red peppers . Vegetarian o Cucumber salad: cucumbers, artichoke hearts, celery, red onion, feta cheese, tossed in olive oil & lemon juice  o Hummus and whole grain pita points with a greek salad (lettuce, tomato, feta, olives, cucumbers, red onion) o Lentil soup with celery, carrots made with vegetable broth, garlic, salt and pepper  o Tabouli salad: parsley, bulgur, mint, scallions, cucumbers, tomato, radishes, lemon juice, olive oil, salt and pepper.

## 2019-05-08 NOTE — Progress Notes (Signed)
[facility]   Progress Note: General Provider: Mike GipAndre Stacey Sago, FNP  SUBJECTIVE:   Nina Clark is a 53 y.o. female who  has a past medical history of Hypertension.. Patient presents today for Hypertension (Follow up)  Patient states that she is upset due to her meloxicam being discontinued. She reports that she has been taking this medication for several years due to a "pulling" in her left leg that extends from the calf to the lower back. She states that at her last visit pertaining to her foot pain, she was unaware of having to stop meloxicam due to starting naproxen. She reports not taking naproxen or ibuprofen and would like to continue with meloxicam. She denies regular exercise or DASH diet compliance. She does not monitor BP at home. Denies chest pain, SOB , dizziness or leg swelling.    Review of Systems  Constitutional: Negative.   HENT: Negative.   Eyes: Negative.   Respiratory: Negative.   Cardiovascular: Negative.   Gastrointestinal: Negative.   Genitourinary: Negative.   Musculoskeletal: Negative.   Skin: Negative.   Neurological: Negative.   Psychiatric/Behavioral: Negative.      OBJECTIVE: BP 116/79 (BP Location: Right Arm, Patient Position: Sitting)   Pulse 97   Temp 98.6 F (37 C) (Oral)   Resp 16   Ht 5' 7.5" (1.715 m)   Wt 167 lb 3.2 oz (75.8 kg)   SpO2 100%   BMI 25.80 kg/m   Wt Readings from Last 3 Encounters:  05/08/19 167 lb 3.2 oz (75.8 kg)  12/14/18 167 lb (75.8 kg)  12/05/18 167 lb (75.8 kg)     Physical Exam Vitals signs and nursing note reviewed.  Constitutional:      General: She is not in acute distress.    Appearance: Normal appearance.  HENT:     Head: Normocephalic and atraumatic.  Eyes:     Extraocular Movements: Extraocular movements intact.     Conjunctiva/sclera: Conjunctivae normal.     Pupils: Pupils are equal, round, and reactive to light.  Cardiovascular:     Rate and  Rhythm: Normal rate and regular rhythm.     Heart sounds: No murmur.  Pulmonary:     Effort: Pulmonary effort is normal.     Breath sounds: Normal breath sounds.  Musculoskeletal: Normal range of motion.  Skin:    General: Skin is warm and dry.  Neurological:     Mental Status: She is alert and oriented to person, place, and time.  Psychiatric:        Mood and Affect: Mood normal.        Behavior: Behavior normal.        Thought Content: Thought content normal.        Judgment: Judgment normal.     ASSESSMENT/PLAN:   1. Essential hypertension No medication changes warranted at the present time.   - Comprehensive metabolic panel - hydrochlorothiazide (HYDRODIURIL) 25 MG tablet; Take 1 tablet (25 mg total) by mouth daily.  Dispense: 30 tablet; Refill: 5 - cloNIDine (CATAPRES) 0.1 MG tablet; Take 1 tablet (0.1 mg total) by mouth 2 (two) times daily.  Dispense: 60 tablet; Refill: 5  2. Left foot pain D/d all NSAIDs except mobic.  - meloxicam (MOBIC) 15 MG tablet; Take 1 tablet (15 mg total) by mouth daily.  Dispense: 30 tablet; Refill: 2  3. Gastroesophageal reflux disease without esophagitis - omeprazole (PRILOSEC) 40 MG capsule; Take 1 capsule (40 mg total) by  mouth daily.  Dispense: 30 capsule; Refill: 5  4. Muscle spasm  - cyclobenzaprine (FLEXERIL) 5 MG tablet; Take 1 tablet (5 mg total) by mouth 3 (three) times daily as needed for muscle spasms.  Dispense: 30 tablet; Refill: 1    Return in 6 months (on 11/08/2019).    The patient was given clear instructions to go to ER or return to medical center if symptoms do not improve, worsen or new problems develop. The patient verbalized understanding and agreed with plan of care.   Ms. Doug Sou. Nathaneil Canary, FNP-BC [facility] [address] [place], [place] [address] [phone]

## 2019-05-09 LAB — COMPREHENSIVE METABOLIC PANEL
ALT: 21 IU/L (ref 0–32)
AST: 23 IU/L (ref 0–40)
Albumin/Globulin Ratio: 1.4 (ref 1.2–2.2)
Albumin: 4.3 g/dL (ref 3.8–4.9)
Alkaline Phosphatase: 82 IU/L (ref 39–117)
BUN/Creatinine Ratio: 14 (ref 9–23)
BUN: 16 mg/dL (ref 6–24)
Bilirubin Total: 0.3 mg/dL (ref 0.0–1.2)
CO2: 27 mmol/L (ref 20–29)
Calcium: 9.6 mg/dL (ref 8.7–10.2)
Chloride: 96 mmol/L (ref 96–106)
Creatinine, Ser: 1.12 mg/dL — ABNORMAL HIGH (ref 0.57–1.00)
GFR calc Af Amer: 64 mL/min/{1.73_m2} (ref >59)
GFR calc non Af Amer: 55 mL/min/{1.73_m2} — ABNORMAL LOW (ref >59)
Globulin, Total: 3 g/dL (ref 1.5–4.5)
Glucose: 91 mg/dL (ref 65–99)
Potassium: 3.9 mmol/L (ref 3.5–5.2)
Sodium: 136 mmol/L (ref 134–144)
Total Protein: 7.3 g/dL (ref 6.0–8.5)

## 2019-05-11 ENCOUNTER — Telehealth: Payer: Self-pay

## 2019-05-11 NOTE — Telephone Encounter (Signed)
-----   Message from Lanae Boast, [place] sent at 05/10/2019  5:31 PM EDT ----- Please let patient know that her kidney function has declined. My concern is that this is due to the long term use of Mobic. I would like for her to make sure she is hydrating well every day. My suggestion is to discontinue mobic for her muscle pain and to start physical therapy for the leg and foot pain. Please let me know if this is something that she is willing to consider.

## 2019-05-11 NOTE — Telephone Encounter (Signed)
Called and spoke with patient, advised that kidney function has declined. Advised that this is due to long term mobic use and she would like to discontinue mobic. Asked that patient make sure she is well hydrated throughout the day. Patient did not want to start PT and wants to speak with provider. She has been scheduled for a phone visit with Nina Clark tomorrow 05/12/2019. Thanks!

## 2019-05-12 ENCOUNTER — Other Ambulatory Visit: Payer: Self-pay

## 2019-05-12 ENCOUNTER — Encounter: Payer: Self-pay | Admitting: Family Medicine

## 2019-05-12 ENCOUNTER — Ambulatory Visit (INDEPENDENT_AMBULATORY_CARE_PROVIDER_SITE_OTHER): Payer: Self-pay | Admitting: Family Medicine

## 2019-05-12 DIAGNOSIS — M79672 Pain in left foot: Secondary | ICD-10-CM

## 2019-05-12 NOTE — Progress Notes (Signed)
  [facility]  Virtual Visit via Telephone Note  I connected with Nina Clark on 05/12/19 at  8:40 AM EDT by telephone and verified that I am speaking with the correct person using two identifiers.   I discussed the limitations, risks, security and privacy concerns of performing an evaluation and management service by telephone and the availability of in person appointments. I also discussed with the patient that there may be a patient responsible charge related to this service. The patient expressed understanding and agreed to proceed.   History of Present Illness: Nina Clark  has a past medical history of Hypertension. Patient has questions about her medications. Due to decreased kidney function, she was instructed to d/c all NSAIDs. She states that she cannot stop mobic. She has secretly been taking naproxen along with mobic. She reports "getting naproxen from a friend" because this provider instructed her that both are NSAIDs and can damage the kidneys. She states that she has been taking both for the past several months. She states that mobic relieves her muscular pain and does not help with her foot pain. She reports naproxen helps with foot pain and not the muscular pain. She contributes the foot pain being due to her current job. She is seeking alternative employment at the present time.  She denies difficulty with urination.    Observations/Objective: Patient with regular voice tone, rate and rhythm. Speaking calmly and is in no apparent distress.    Assessment and Plan: 1. Left foot pain Patient is to discontinue taking Naproxen and to start tylenol for foot pain. She will continue with mobic as she refuses to stop all NSAIDs. She will return for labs at her next visit.     Follow Up Instructions:  We discussed hand washing, using hand sanitizer when soap and water are not available, only going out when absolutely necessary, and  social distancing. Explained to patient that she is immunocompromised and will need to take precautions during this time.   I discussed the assessment and treatment plan with the patient. The patient was provided an opportunity to ask questions and all were answered. The patient agreed with the plan and demonstrated an understanding of the instructions.   The patient was advised to call back or seek an in-person evaluation if the symptoms worsen or if the condition fails to improve as anticipated.  I provided 12 minutes of non-face-to-face time during this encounter.  Ms. Andr L. Nathaneil Canary, FNP-BC [facility] [address] [place], [place] [address] [phone]

## 2019-05-12 NOTE — Patient Instructions (Signed)

## 2019-06-06 MED FILL — cloNIDine HCL 0.1 MG TABS: 0.1 | 30 days supply | Qty: 60 | Fill #1

## 2019-06-06 MED FILL — MELOXICAM 15 MG TABLET: 15 | 30 days supply | Qty: 30 | Fill #1

## 2019-06-06 MED FILL — ?HYDROCHLOROTHIAZIDE 25MG T: 25 | 30 days supply | Qty: 30 | Fill #1

## 2019-06-06 MED FILL — OMEPRAZOLE DR 40 MG CAPSULE: 40 | 30 days supply | Qty: 30 | Fill #1

## 2019-07-07 MED FILL — ?HYDROCHLOROTHIAZIDE 25MG T: 25 | 30 days supply | Qty: 30 | Fill #2

## 2019-07-07 MED FILL — MELOXICAM 15 MG TABLET: 15 | 30 days supply | Qty: 30 | Fill #2

## 2019-07-07 MED FILL — OMEPRAZOLE DR 40 MG CAPSULE: 40 | 30 days supply | Qty: 30 | Fill #2

## 2019-07-07 MED FILL — cloNIDine HCL 0.1 MG TABS: 0.1 | 30 days supply | Qty: 60 | Fill #2

## 2019-07-12 ENCOUNTER — Encounter (HOSPITAL_COMMUNITY): Payer: Self-pay

## 2019-07-12 ENCOUNTER — Encounter (HOSPITAL_COMMUNITY): Payer: Self-pay | Admitting: *Deleted

## 2019-07-25 ENCOUNTER — Other Ambulatory Visit: Payer: Self-pay

## 2019-07-25 ENCOUNTER — Encounter: Payer: Self-pay | Admitting: Family Medicine

## 2019-07-25 ENCOUNTER — Ambulatory Visit (INDEPENDENT_AMBULATORY_CARE_PROVIDER_SITE_OTHER): Payer: Self-pay | Admitting: Family Medicine

## 2019-07-25 VITALS — BP 144/84 | HR 98 | Temp 97.7°F | Ht 64.5 in | Wt 174.4 lb

## 2019-07-25 DIAGNOSIS — I10 Essential (primary) hypertension: Secondary | ICD-10-CM

## 2019-07-25 DIAGNOSIS — Z09 Encounter for follow-up examination after completed treatment for conditions other than malignant neoplasm: Secondary | ICD-10-CM

## 2019-07-25 DIAGNOSIS — M79642 Pain in left hand: Secondary | ICD-10-CM

## 2019-07-25 DIAGNOSIS — T148XXA Other injury of unspecified body region, initial encounter: Secondary | ICD-10-CM | POA: Insufficient documentation

## 2019-07-25 DIAGNOSIS — R202 Paresthesia of skin: Secondary | ICD-10-CM

## 2019-07-25 DIAGNOSIS — R200 Anesthesia of skin: Secondary | ICD-10-CM | POA: Insufficient documentation

## 2019-07-25 DIAGNOSIS — M79641 Pain in right hand: Secondary | ICD-10-CM | POA: Insufficient documentation

## 2019-07-25 NOTE — Progress Notes (Signed)
[facility]   Sick Visit  Subjective:  Patient ID: Nina Clark, female    DOB: 1966-08-21  Age: 53 y.o. MRN: [id number]  CC:  Chief Complaint  Patient presents with  . Follow-up    having pain in both pain, numbness, tingling ,soaking them in hot water , wearing brace     HPI Nina Clark is a 53 year old female who presents for Sick Visit today.  Past Medical History:  Diagnosis Date  . Carpal tunnel syndrome on both sides   . Hypertension    Current Status: Ms. Hinzman is a former patient of Mike Gip, NP. Since her last office visit, she has c/o numbness/tingling/pain of both hands X about 4 days now. She has been soaking in [facility] and Naproxen with minimal relief of symptoms. She has a history of Carpal Tunel Syndrome. She states that the pain is improving.She continues to have chronic pain in her left foot. She denies visual changes, chest pain, cough, shortness of breath, heart palpitations, and falls. She has occasional headaches and dizziness with position changes. Denies severe headaches, confusion, seizures, double vision, and blurred vision, nausea and vomiting.  She denies fevers, chills, fatigue, recent infections, weight loss, and night sweats. No reports of GI problems such as diarrhea, and constipation. She has no reports of blood in stools, dysuria and hematuria. No depression or anxiety reported. She denies pain today.   Past Surgical History:  Procedure Laterality Date  . EYE SURGERY      Family History  Problem Relation Age of Onset  . Hypertension Mother   . Hypertension Father   . Diabetes Father   . Diabetes Sister     Social History   Socioeconomic History  . Marital status: Single    Spouse name: Not on file  . Number of children: 1  . Years of education: College  . Highest education level: Not on file  Occupational History    Employer: OTHER    Comment: [employer]  Social Needs  .  Financial resource strain: Not on file  . Food insecurity    Worry: Not on file    Inability: Not on file  . Transportation needs    Medical: Not on file    Non-medical: Not on file  Tobacco Use  . Smoking status: Current Every Day Smoker    Packs/day: 0.25    Years: 25.00    Pack years: 6.25    Types: Cigarettes  . Smokeless tobacco: Never Used  Substance and Sexual Activity  . Alcohol use: No    Alcohol/week: 0.0 standard drinks  . Drug use: Yes    Types: Marijuana    Comment: daily  . Sexual activity: Yes    Birth control/protection: None  Lifestyle  . Physical activity    Days per week: Not on file    Minutes per session: Not on file  . Stress: Not on file  Relationships  . Social Musician on phone: Not on file    Gets together: Not on file    Attends religious service: Not on file    Active member of club or organization: Not on file    Attends meetings of clubs or organizations: Not on file    Relationship status: Not on file  . Intimate partner violence    Fear of current or ex partner: Not on file    Emotionally abused: Not on file  Physically abused: Not on file    Forced sexual activity: Not on file  Other Topics Concern  . Not on file  Social History Narrative   Patient lives with family.   Caffeine Use: none    Outpatient Medications Prior to Visit  Medication Sig Dispense Refill  . cloNIDine (CATAPRES) 0.1 MG tablet Take 1 tablet (0.1 mg total) by mouth 2 (two) times daily. 60 tablet 5  . hydrochlorothiazide (HYDRODIURIL) 25 MG tablet Take 1 tablet (25 mg total) by mouth daily. 30 tablet 5  . meloxicam (MOBIC) 15 MG tablet Take 1 tablet (15 mg total) by mouth daily. 30 tablet 2  . omeprazole (PRILOSEC) 40 MG capsule Take 1 capsule (40 mg total) by mouth daily. 30 capsule 5  . cyclobenzaprine (FLEXERIL) 5 MG tablet Take 1 tablet (5 mg total) by mouth 3 (three) times daily as needed for muscle spasms. (Patient not taking: Reported on  07/25/2019) 30 tablet 1   No facility-administered medications prior to visit.     No Known Allergies  ROS Review of Systems  Constitutional: Negative.   HENT: Negative.   Eyes: Negative.   Respiratory: Negative.   Cardiovascular: Negative.   Gastrointestinal: Negative.   Endocrine: Negative.   Genitourinary: Negative.   Musculoskeletal: Negative.   Skin: Negative.   Allergic/Immunologic: Negative.   Neurological: Positive for dizziness (occasional ) and headaches (occasional ).  Hematological: Negative.   Psychiatric/Behavioral: Negative.    Objective:    Physical Exam  Constitutional: She is oriented to person, place, and time. She appears well-developed and well-nourished.  HENT:  Head: Normocephalic and atraumatic.  Eyes: Conjunctivae are normal.  Neck: Normal range of motion. Neck supple.  Cardiovascular: Normal rate, regular rhythm, normal heart sounds and intact distal pulses.  Pulmonary/Chest: Effort normal and breath sounds normal.  Abdominal: Soft. Bowel sounds are normal.  Musculoskeletal: Normal range of motion.  Neurological: She is alert and oriented to person, place, and time. She has normal reflexes.  Skin: Skin is warm and dry.  Psychiatric: She has a normal mood and affect. Her behavior is normal. Judgment and thought content normal.  Nursing note and vitals reviewed.   BP (!) 144/84 (BP Location: Right Arm, Patient Position: Sitting, Cuff Size: Normal)   Pulse 98   Temp 97.7 F (36.5 C) (Oral)   Ht 5' 4.5" (1.638 m)   Wt 174 lb 6.4 oz (79.1 kg)   BMI 29.47 kg/m  Wt Readings from Last 3 Encounters:  07/25/19 174 lb 6.4 oz (79.1 kg)  05/08/19 167 lb 3.2 oz (75.8 kg)  12/14/18 167 lb (75.8 kg)     There are no preventive care reminders to display for this patient.  There are no preventive care reminders to display for this patient.  Lab Results  Component Value Date   TSH 0.54 05/18/2016   Lab Results  Component Value Date   WBC 5.6  05/04/2018   HGB 12.3 05/04/2018   HCT 35.3 05/04/2018   MCV 87 05/04/2018   PLT 354 05/04/2018   Lab Results  Component Value Date   NA 136 05/08/2019   K 3.9 05/08/2019   CO2 27 05/08/2019   GLUCOSE 91 05/08/2019   BUN 16 05/08/2019   CREATININE 1.12 (H) 05/08/2019   BILITOT 0.3 05/08/2019   ALKPHOS 82 05/08/2019   AST 23 05/08/2019   ALT 21 05/08/2019   PROT 7.3 05/08/2019   ALBUMIN 4.3 05/08/2019   CALCIUM 9.6 05/08/2019   ANIONGAP 10 01/30/2016  Lab Results  Component Value Date   CHOL 207 (H) 11/07/2018   Lab Results  Component Value Date   HDL 70 11/07/2018   Lab Results  Component Value Date   LDLCALC 114 (H) 11/07/2018   Lab Results  Component Value Date   TRIG 115 11/07/2018   Lab Results  Component Value Date   CHOLHDL 3.0 11/07/2018   No results found for: HGBA1C    Assessment & Plan:   1. Essential hypertension The current medical regimen is effective; blood pressure is stable at 144/84 today; continue present plan and medications as prescribed. She will continue to decrease high sodium intake, excessive alcohol intake, increase potassium intake, smoking cessation, and increase physical activity of at least 30 minutes of cardio activity daily. She will continue to follow Heart Healthy or DASH diet.  2. Bilateral hand pain  3. Muscle strain She will continue Mobic as needed and use RICE to aide in healing and pain relief.   4. Numbness and tingling in both hands  5. Follow up She will follow up in 6 months.   No orders of the defined types were placed in this encounter.   No orders of the defined types were placed in this encounter.  Referral Orders  No referral(s) requested today    Kathe Becton,  MSN, FNP-BC [facility] [address] [place], [place] [address] [phone]- fax   Problem List Items Addressed This Visit      Cardiovascular  and Mediastinum   Essential hypertension - Primary    Other Visit Diagnoses    Bilateral hand pain       Muscle strain       Numbness and tingling in both hands       Follow up          No orders of the defined types were placed in this encounter.   Follow-up: Return in about 6 months (around 01/22/2020).    Azzie Glatter, FNP

## 2019-07-25 NOTE — Patient Instructions (Signed)
Muscle Strain A muscle strain is an injury that occurs when a muscle is stretched beyond its normal length. Usually, a small number of muscle fibers are torn when this happens. There are three types of muscle strains. First-degree strains have the least amount of muscle fiber tearing and the least amount of pain. Second-degree and third-degree strains have more tearing and pain. Usually, recovery from muscle strain takes 1-2 weeks. Complete healing normally takes 5-6 weeks. What are the causes? This condition is caused when a sudden, violent force is placed on a muscle and stretches it too far. This may occur with a fall, lifting, or sports. What increases the risk? This condition is more likely to develop in athletes and people who are physically active. What are the signs or symptoms? Symptoms of this condition include:  Pain.  Bruising.  Swelling.  Trouble using the muscle. How is this diagnosed? This condition is diagnosed based on a physical exam and your medical history. Tests may also be done, including an X-ray, ultrasound, or MRI. How is this treated? This condition is initially treated with PRICE therapy. This therapy involves:  Protecting the muscle from being injured again.  Resting the injured muscle.  Icing the injured muscle.  Applying pressure (compression) to the injured muscle. This may be done with a splint or elastic bandage.  Raising (elevating) the injured muscle. Your health care provider may also recommend medicine for pain. Follow these instructions at home: If you have a splint:  Wear the splint as told by your health care provider. Remove it only as told by your health care provider.  Loosen the splint if your fingers or toes tingle, become numb, or turn cold and blue.  Keep the splint clean.  If the splint is not waterproof: ? Do not let it get wet. ? Cover it with a watertight covering when you take a bath or a shower. Managing pain, stiffness,  and swelling   If directed, put ice on the injured area. ? If you have a removable splint, remove it as told by your health care provider. ? Put ice in a plastic bag. ? Place a towel between your skin and the bag. ? Leave the ice on for 20 minutes, 2-3 times a day.  Move your fingers or toes often to avoid stiffness and to lessen swelling.  Raise (elevate) the injured area above the level of your heart while you are sitting or lying down.  Wear an elastic bandage as told by your health care provider. Make sure that it is not too tight. General instructions  Take over-the-counter and prescription medicines only as told by your health care provider.  Restrict your activity and rest the injured muscle as told by your health care provider. Gentle movements may be allowed.  If physical therapy was prescribed, do exercises as told by your health care provider.  Do not put pressure on any part of the splint until it is fully hardened. This may take several hours.  Do not use any products that contain nicotine or tobacco, such as cigarettes and e-cigarettes. These can delay bone healing. If you need help quitting, ask your health care provider.  Ask your health care provider when it is safe to drive if you have a splint.  Keep all follow-up visits as told by your health care provider. This is important. How is this prevented?  Warm up before exercising. This helps to prevent future muscle strains. Contact a health care provider if:    You have more pain or swelling in the injured area. Get help right away if:  You have numbness or tingling or lose a lot of strength in the injured area. Summary  A muscle strain is an injury that occurs when a muscle is stretched beyond its normal length.  This condition is caused when a sudden, violent force is placed on a muscle and stretches it too far.  This condition is initially treated with PRICE therapy, which involves protecting, resting,  icing, compressing, and elevating.  Gentle movements may be allowed. If physical therapy was prescribed, do exercises as told by your health care provider. This information is not intended to replace advice given to you by your health care provider. Make sure you discuss any questions you have with your health care provider. Document Released: 10/19/2005 Document Revised: 10/01/2017 Document Reviewed: 11/25/2016 Elsevier Patient Education  2020 [employer]  

## 2019-08-03 ENCOUNTER — Other Ambulatory Visit: Payer: Self-pay

## 2019-08-03 DIAGNOSIS — K219 Gastro-esophageal reflux disease without esophagitis: Secondary | ICD-10-CM

## 2019-08-03 DIAGNOSIS — I10 Essential (primary) hypertension: Secondary | ICD-10-CM

## 2019-08-03 DIAGNOSIS — M79672 Pain in left foot: Secondary | ICD-10-CM

## 2019-08-03 MED ORDER — HYDROCHLOROTHIAZIDE 25 MG PO TABS: 25.0000 mg | ORAL_TABLET | Freq: Every day | ORAL | 5 refills | Status: DC

## 2019-08-03 MED ORDER — OMEPRAZOLE 40 MG PO CPDR: 40.0000 mg | DELAYED_RELEASE_CAPSULE | Freq: Every day | ORAL | 5 refills | Status: DC

## 2019-08-03 MED ORDER — MELOXICAM 15 MG PO TABS: 15.0000 mg | ORAL_TABLET | Freq: Every day | ORAL | 2 refills | Status: DC

## 2019-08-03 MED ORDER — CLONIDINE HCL 0.1 MG PO TABS: 0.1000 mg | ORAL_TABLET | Freq: Two times a day (BID) | ORAL | 5 refills | Status: DC

## 2019-08-03 MED FILL — cloNIDine HCL 0.1 MG TABS: 0.1 | 30 days supply | Qty: 60 | Fill #3

## 2019-08-03 MED FILL — OMEPRAZOLE DR 40 MG CAPSULE: 40 | 30 days supply | Qty: 30 | Fill #3

## 2019-08-03 MED FILL — ?HYDROCHLOROTHIAZIDE 25MG T: 25 | 30 days supply | Qty: 30 | Fill #3

## 2019-08-03 MED FILL — MELOXICAM 15 MG TABLET: 15 | 30 days supply | Qty: 30 | Fill #0

## 2019-09-04 MED FILL — OMEPRAZOLE DR 40 MG CAPSULE: 40 | 30 days supply | Qty: 30 | Fill #4

## 2019-09-04 MED FILL — MELOXICAM 15 MG TABLET: 15 | 30 days supply | Qty: 30 | Fill #1

## 2019-09-04 MED FILL — CYCLOBENZAPRINE 5 MG TABLET: 5 | 10 days supply | Qty: 30 | Fill #1

## 2019-09-04 MED FILL — cloNIDine HCL 0.1 MG TABS: 0.1 | 30 days supply | Qty: 60 | Fill #4

## 2019-09-04 MED FILL — ?HYDROCHLOROTHIAZIDE 25MG T: 25 | 30 days supply | Qty: 30 | Fill #4

## 2019-10-02 MED FILL — cloNIDine HCL 0.1 MG TABS: 0.1 | 30 days supply | Qty: 60 | Fill #5

## 2019-10-02 MED FILL — MELOXICAM 15 MG TABLET: 15 | 30 days supply | Qty: 30 | Fill #2

## 2019-10-02 MED FILL — ?HYDROCHLOROTHIAZIDE 25MG T: 25 | 30 days supply | Qty: 30 | Fill #5

## 2019-10-02 MED FILL — OMEPRAZOLE DR 40 MG CAPSULE: 40 | 30 days supply | Qty: 30 | Fill #5

## 2019-11-01 MED FILL — OMEPRAZOLE DR 40 MG CAPSULE: 40 | 30 days supply | Qty: 30 | Fill #0

## 2019-11-01 MED FILL — cloNIDine HCL 0.1 MG TABS: 0.1 | 30 days supply | Qty: 60 | Fill #0

## 2019-11-01 MED FILL — ?HYDROCHLOROTHIAZIDE 25MG T: 25 | 30 days supply | Qty: 30 | Fill #0

## 2019-11-01 MED FILL — MELOXICAM 15 MG TABLET: 15 | 30 days supply | Qty: 30 | Fill #0

## 2019-11-08 ENCOUNTER — Ambulatory Visit: Payer: No Typology Code available for payment source | Admitting: Family Medicine

## 2019-12-01 ENCOUNTER — Other Ambulatory Visit: Payer: Self-pay | Admitting: Family Medicine

## 2019-12-01 DIAGNOSIS — M62838 Other muscle spasm: Secondary | ICD-10-CM

## 2019-12-01 MED FILL — OMEPRAZOLE DR 40 MG CAPSULE: 40 | 30 days supply | Qty: 30 | Fill #1

## 2019-12-01 MED FILL — CYCLOBENZAPRINE 5 MG TABLET: 5 | 10 days supply | Qty: 30 | Fill #0

## 2019-12-01 MED FILL — cloNIDine HCL 0.1 MG TABS: 0.1 | 30 days supply | Qty: 60 | Fill #1

## 2019-12-01 MED FILL — MELOXICAM 15 MG TABLET: 15 | 30 days supply | Qty: 30 | Fill #1

## 2019-12-01 MED FILL — ?HYDROCHLOROTHIAZIDE 25MG T: 25 | 30 days supply | Qty: 30 | Fill #1

## 2019-12-01 NOTE — Telephone Encounter (Signed)
Refill request for cyclobenzaprine. Please advise.  

## 2020-01-01 DIAGNOSIS — R7303 Prediabetes: Secondary | ICD-10-CM

## 2020-01-01 HISTORY — DX: Prediabetes: R73.03

## 2020-01-02 ENCOUNTER — Other Ambulatory Visit: Payer: Self-pay

## 2020-01-02 DIAGNOSIS — I10 Essential (primary) hypertension: Secondary | ICD-10-CM

## 2020-01-02 MED ORDER — CLONIDINE HCL 0.1 MG PO TABS: 0.1000 mg | ORAL_TABLET | Freq: Two times a day (BID) | ORAL | 5 refills | Status: DC

## 2020-01-02 MED FILL — ?HYDROCHLOROTHIAZIDE 25MG T: 25 | 30 days supply | Qty: 30 | Fill #2

## 2020-01-02 MED FILL — cloNIDine HCL 0.1 MG TABS: 0.1 | 30 days supply | Qty: 60 | Fill #0

## 2020-01-02 MED FILL — MELOXICAM 15 MG TABLET: 15 | 30 days supply | Qty: 30 | Fill #2

## 2020-01-02 MED FILL — OMEPRAZOLE DR 40 MG CAPSULE: 40 | 30 days supply | Qty: 30 | Fill #2

## 2020-01-22 ENCOUNTER — Other Ambulatory Visit: Payer: Self-pay

## 2020-01-22 ENCOUNTER — Encounter: Payer: Self-pay | Admitting: Family Medicine

## 2020-01-22 ENCOUNTER — Ambulatory Visit (INDEPENDENT_AMBULATORY_CARE_PROVIDER_SITE_OTHER): Payer: Self-pay | Admitting: Family Medicine

## 2020-01-22 VITALS — BP 135/70 | HR 69 | Temp 98.1°F | Ht 64.5 in | Wt 161.4 lb

## 2020-01-22 DIAGNOSIS — R200 Anesthesia of skin: Secondary | ICD-10-CM

## 2020-01-22 DIAGNOSIS — M79605 Pain in left leg: Secondary | ICD-10-CM | POA: Insufficient documentation

## 2020-01-22 DIAGNOSIS — R202 Paresthesia of skin: Secondary | ICD-10-CM

## 2020-01-22 DIAGNOSIS — M79642 Pain in left hand: Secondary | ICD-10-CM

## 2020-01-22 DIAGNOSIS — Z09 Encounter for follow-up examination after completed treatment for conditions other than malignant neoplasm: Secondary | ICD-10-CM

## 2020-01-22 DIAGNOSIS — M79641 Pain in right hand: Secondary | ICD-10-CM

## 2020-01-22 DIAGNOSIS — K219 Gastro-esophageal reflux disease without esophagitis: Secondary | ICD-10-CM

## 2020-01-22 DIAGNOSIS — Z0000 Encounter for general adult medical examination without abnormal findings: Secondary | ICD-10-CM

## 2020-01-22 DIAGNOSIS — M79672 Pain in left foot: Secondary | ICD-10-CM | POA: Insufficient documentation

## 2020-01-22 DIAGNOSIS — I10 Essential (primary) hypertension: Secondary | ICD-10-CM

## 2020-01-22 LAB — POCT URINALYSIS DIPSTICK
Bilirubin, UA: NEGATIVE
Blood, UA: NEGATIVE
Glucose, UA: NEGATIVE
Ketones, UA: NEGATIVE
Leukocytes, UA: NEGATIVE
Nitrite, UA: NEGATIVE
Protein, UA: NEGATIVE
Spec Grav, UA: >=1.03 — AB (ref 1.010–1.025)
Urobilinogen, UA: 0.2 E.U./dL
pH, UA: 5.5 (ref 5.0–8.0)

## 2020-01-22 LAB — GLUCOSE, POCT (MANUAL RESULT ENTRY): POC Glucose: 127 mg/dl — AB (ref 70–99)

## 2020-01-22 LAB — POCT GLYCOSYLATED HEMOGLOBIN (HGB A1C): Hemoglobin A1C: 6 % — AB (ref 4.0–5.6)

## 2020-01-22 MED ORDER — MELOXICAM 15 MG PO TABS: 15.0000 mg | ORAL_TABLET | Freq: Every day | ORAL | 6 refills | Status: DC

## 2020-01-22 MED ORDER — CYCLOBENZAPRINE HCL 10 MG PO TABS: 10.0000 mg | ORAL_TABLET | Freq: Three times a day (TID) | ORAL | 6 refills | Status: DC | PRN

## 2020-01-22 MED ORDER — CLONIDINE HCL 0.1 MG PO TABS: 0.1000 mg | ORAL_TABLET | Freq: Two times a day (BID) | ORAL | 6 refills | Status: DC

## 2020-01-22 MED ORDER — HYDROCHLOROTHIAZIDE 25 MG PO TABS: 25.0000 mg | ORAL_TABLET | Freq: Every day | ORAL | 6 refills | Status: DC

## 2020-01-22 MED ORDER — OMEPRAZOLE 40 MG PO CPDR: 40.0000 mg | DELAYED_RELEASE_CAPSULE | Freq: Every day | ORAL | 6 refills | Status: DC

## 2020-01-22 MED FILL — CYCLOBENZAPRINE 10 MG TAB: 10 | 10 days supply | Qty: 30 | Fill #0

## 2020-01-22 NOTE — Patient Instructions (Signed)
Preventing Type 2 Diabetes Mellitus Type 2 diabetes (type 2 diabetes mellitus) is a long-term (chronic) disease that affects blood sugar (glucose) levels. Normally, a hormone called insulin allows glucose to enter cells in the body. The cells use glucose for energy. In type 2 diabetes, one or both of these problems may be present:  The body does not make enough insulin.  The body does not respond properly to insulin that it makes (insulin resistance). Insulin resistance or lack of insulin causes excess glucose to build up in the blood instead of going into cells. As a result, high blood glucose (hyperglycemia) develops, which can cause many complications. Being overweight or obese and having an inactive (sedentary) lifestyle can increase your risk for diabetes. Type 2 diabetes can be delayed or prevented by making certain nutrition and lifestyle changes. What nutrition changes can be made?   Eat healthy meals and snacks regularly. Keep a healthy snack with you for when you get hungry between meals, such as fruit or a handful of nuts.  Eat lean meats and proteins that are low in saturated fats, such as chicken, fish, egg whites, and beans. Avoid processed meats.  Eat plenty of fruits and vegetables and plenty of grains that have not been processed (whole grains). It is recommended that you eat: ? 1?2 cups of fruit every day. ? 2?3 cups of vegetables every day. ? 6?8 oz of whole grains every day, such as oats, whole wheat, bulgur, brown rice, quinoa, and millet.  Eat low-fat dairy products, such as milk, yogurt, and cheese.  Eat foods that contain healthy fats, such as nuts, avocado, olive oil, and canola oil.  Drink water throughout the day. Avoid drinks that contain added sugar, such as soda or sweet tea.  Follow instructions from your health care provider about specific eating or drinking restrictions.  Control how much food you eat at a time (portion size). ? Check food labels to find  out the serving sizes of foods. ? Use a kitchen scale to weigh amounts of foods.  Saute or steam food instead of frying it. Cook with water or broth instead of oils or butter.  Limit your intake of: ? Salt (sodium). Have no more than 1 tsp (2,400 mg) of sodium a day. If you have heart disease or high blood pressure, have less than ? tsp (1,500 mg) of sodium a day. ? Saturated fat. This is fat that is solid at room temperature, such as butter or fat on meat. What lifestyle changes can be made? Activity   Do moderate-intensity physical activity for at least 30 minutes on at least 5 days of the week, or as much as told by your health care provider.  Ask your health care provider what activities are safe for you. A mix of physical activities may be best, such as walking, swimming, cycling, and strength training.  Try to add physical activity into your day. For example: ? Park in spots that are farther away than usual, so that you walk more. For example, park in a far corner of the parking lot when you go to the office or the grocery store. ? Take a walk during your lunch break. ? Use stairs instead of elevators or escalators. Weight Loss  Lose weight as directed. Your health care provider can determine how much weight loss is best for you and can help you lose weight safely.  If you are overweight or obese, you may be instructed to lose at least 5?7 %   of your body weight. Alcohol and Tobacco   Limit alcohol intake to no more than 1 drink a day for nonpregnant women and 2 drinks a day for men. One drink equals 12 oz of beer, 5 oz of wine, or 1 oz of hard liquor.  Do not use any tobacco products, such as cigarettes, chewing tobacco, and e-cigarettes. If you need help quitting, ask your health care provider. Work With Your Health Care Provider  Have your blood glucose tested regularly, as told by your health care provider.  Discuss your risk factors and how you can reduce your risk for  diabetes.  Get screening tests as told by your health care provider. You may have screening tests regularly, especially if you have certain risk factors for type 2 diabetes.  Make an appointment with a diet and nutrition specialist (registered dietitian). A registered dietitian can help you make a healthy eating plan and can help you understand portion sizes and food labels. Why are these changes important?  It is possible to prevent or delay type 2 diabetes and related health problems by making lifestyle and nutrition changes.  It can be difficult to recognize signs of type 2 diabetes. The best way to avoid possible damage to your body is to take actions to prevent the disease before you develop symptoms. What can happen if changes are not made?  Your blood glucose levels may keep increasing. Having high blood glucose for a long time is dangerous. Too much glucose in your blood can damage your blood vessels, heart, kidneys, nerves, and eyes.  You may develop prediabetes or type 2 diabetes. Type 2 diabetes can lead to many chronic health problems and complications, such as: ? Heart disease. ? Stroke. ? Blindness. ? Kidney disease. ? Depression. ? Poor circulation in the feet and legs, which could lead to surgical removal (amputation) in severe cases. Where to find support  Ask your health care provider to recommend a registered dietitian, diabetes educator, or weight loss program.  Look for local or online weight loss groups.  Join a gym, fitness club, or outdoor activity group, such as a walking club. Where to find more information To learn more about diabetes and diabetes prevention, visit:  American Diabetes Association (ADA): www.diabetes.[employer] of Diabetes and Digestive and Kidney Diseases: ToyArticles.ca To learn more about healthy eating, visit:  The U.S. Department of Agriculture Architect), Choose My Plate:  http://yates.biz/  Office of Disease Prevention and Health Promotion (ODPHP), Dietary Guidelines: ListingMagazine.si Summary  You can reduce your risk for type 2 diabetes by increasing your physical activity, eating healthy foods, and losing weight as directed.  Talk with your health care provider about your risk for type 2 diabetes. Ask about any blood tests or screening tests that you need to have. This information is not intended to replace advice given to you by your health care provider. Make sure you discuss any questions you have with your health care provider. Document Revised: 02/10/2019 Document Reviewed: 12/10/2015 Elsevier Patient Education  2020 Elsevier Inc.   Prediabetes Eating Plan Prediabetes is a condition that causes blood sugar (glucose) levels to be higher than normal. This increases the risk for developing diabetes. In order to prevent diabetes from developing, your health care provider may recommend a diet and other lifestyle changes to help you:  Control your blood glucose levels.  Improve your cholesterol levels.  Manage your blood pressure. Your health care provider may recommend working with a diet and nutrition specialist (  dietitian) to make a meal plan that is best for you. What are tips for following this plan? Lifestyle  Set weight loss goals with the help of your health care team. It is recommended that most people with prediabetes lose 7% of their current body weight.  Exercise for at least 30 minutes at least 5 days a week.  Attend a support group or seek ongoing support from a mental health counselor.  Take over-the-counter and prescription medicines only as told by your health care provider. Reading food labels  Read food labels to check the amount of fat, salt (sodium), and sugar in prepackaged foods. Avoid foods that have: ? Saturated fats. ? Trans fats. ? Added sugars.  Avoid foods that have more than 300  milligrams (mg) of sodium per serving. Limit your daily sodium intake to less than 2,300 mg each day. Shopping  Avoid buying pre-made and processed foods. Cooking  Cook with olive oil. Do not use butter, lard, or ghee.  Bake, broil, grill, or boil foods. Avoid frying. Meal planning   Work with your dietitian to develop an eating plan that is right for you. This may include: ? Tracking how many calories you take in. Use a food diary, notebook, or mobile application to track what you eat at each meal. ? Using the glycemic index (GI) to plan your meals. The index tells you how quickly a food will raise your blood glucose. Choose low-GI foods. These foods take a longer time to raise blood glucose.  Consider following a Mediterranean diet. This diet includes: ? Several servings each day of fresh fruits and vegetables. ? Eating fish at least twice a week. ? Several servings each day of whole grains, beans, nuts, and seeds. ? Using olive oil instead of other fats. ? Moderate alcohol consumption. ? Eating small amounts of red meat and whole-fat dairy.  If you have high blood pressure, you may need to limit your sodium intake or follow a diet such as the DASH eating plan. DASH is an eating plan that aims to lower high blood pressure. What foods are recommended? The items listed below may not be a complete list. Talk with your dietitian about what dietary choices are best for you. Grains Whole grains, such as whole-wheat or whole-grain breads, crackers, cereals, and pasta. Unsweetened oatmeal. Bulgur. Barley. Quinoa. Brown rice. Corn or whole-wheat flour tortillas or taco shells. Vegetables Lettuce. Spinach. Peas. Beets. Cauliflower. Cabbage. Broccoli. Carrots. Tomatoes. Squash. Eggplant. Herbs. Peppers. Onions. Cucumbers. Brussels sprouts. Fruits Berries. Bananas. Apples. Oranges. Grapes. Papaya. Mango. Pomegranate. Kiwi. Grapefruit. Cherries. Meats and other protein foods Seafood. Poultry  without skin. Lean cuts of pork and beef. Tofu. Eggs. Nuts. Beans. Dairy Low-fat or fat-free dairy products, such as yogurt, cottage cheese, and cheese. Beverages Water. Tea. Coffee. Sugar-free or diet soda. Seltzer water. Lowfat or no-fat milk. Milk alternatives, such as soy or almond milk. Fats and oils Olive oil. Canola oil. Sunflower oil. Grapeseed oil. Avocado. Walnuts. Sweets and desserts Sugar-free or low-fat pudding. Sugar-free or low-fat ice cream and other frozen treats. Seasoning and other foods Herbs. Sodium-free spices. Mustard. Relish. Low-fat, low-sugar ketchup. Low-fat, low-sugar barbecue sauce. Low-fat or fat-free mayonnaise. What foods are not recommended? The items listed below may not be a complete list. Talk with your dietitian about what dietary choices are best for you. Grains Refined white flour and flour products, such as bread, pasta, snack foods, and cereals. Vegetables Canned vegetables. Frozen vegetables with butter or cream sauce. Fruits Fruits canned  with syrup. Meats and other protein foods Fatty cuts of meat. Poultry with skin. Breaded or fried meat. Processed meats. Dairy Full-fat yogurt, cheese, or milk. Beverages Sweetened drinks, such as sweet iced tea and soda. Fats and oils Butter. Lard. Ghee. Sweets and desserts Baked goods, such as cake, cupcakes, pastries, cookies, and cheesecake. Seasoning and other foods Spice mixes with added salt. Ketchup. Barbecue sauce. Mayonnaise. Summary  To prevent diabetes from developing, you may need to make diet and other lifestyle changes to help control blood sugar, improve cholesterol levels, and manage your blood pressure.  Set weight loss goals with the help of your health care team. It is recommended that most people with prediabetes lose 7 percent of their current body weight.  Consider following a Mediterranean diet that includes plenty of fresh fruits and vegetables, whole grains, beans, nuts, seeds,  fish, lean meat, low-fat dairy, and healthy oils. This information is not intended to replace advice given to you by your health care provider. Make sure you discuss any questions you have with your health care provider. Document Revised: 02/10/2019 Document Reviewed: 12/23/2016 Elsevier Patient Education  2020 [employer].

## 2020-01-22 NOTE — Progress Notes (Signed)
[facility]   Established Patient Office Visit  Subjective:  Patient ID: Nina Clark, female    DOB: 10-Sep-1966  Age: 54 y.o. MRN: [id number]  CC:  Chief Complaint  Patient presents with  . Follow-up    HTN  . Foot Pain    on going left foot pain    HPI Nina Clark is a 54 year old female who presents for Follow Up today.   Past Medical History:  Diagnosis Date  . Carpal tunnel syndrome on both sides   . History of vertigo   . Hyperglycemia 01/3020  . Hypertension   . Prediabetes 01/2020   Current Status: Since her last office visit, she continues to have c/o left foot pain/swelling, which she states that she r/t steel toe boots. 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 She is going to shop for more comfortable shoes. She is doing well with no complaints. She states that she no longer has numbness/tingling in hands, since she left a recent job, which she quit 05/2020. She denies visual changes, chest pain, cough, shortness of breath, heart palpitations, and falls. She has occasional headaches and dizziness with position changes. Denies severe headaches, confusion, seizures, double vision, and blurred vision, nausea and vomiting. She denies fevers, chills, fatigue, recent infections, weight loss, and night sweats. No reports of GI problems such diarrhea, and constipation. She has no reports of blood in stools, dysuria and hematuria. No depression or anxiety reported today. She denies suicidal ideations, homicidal ideations, or auditory hallucinations. She had moderate pain today.   Past Surgical History:  Procedure Laterality Date  . EYE SURGERY      Family History  Problem Relation Age of Onset  . Hypertension Mother   . Hypertension Father   . Diabetes Father   . Diabetes Sister     Social History   Socioeconomic History  . Marital status: Single   Spouse name: Not on file  . Number of children: 1  . Years of education: College  . Highest education level: Not on file  Occupational History    Employer: OTHER    Comment: [employer]  Tobacco Use  . Smoking status: Current Every Day Smoker    Packs/day: 0.25    Years: 25.00    Pack years: 6.25    Types: Cigarettes  . Smokeless tobacco: Never Used  Substance and Sexual Activity  . Alcohol use: No    Alcohol/week: 0.0 standard drinks  . Drug use: Yes    Types: Marijuana    Comment: daily  . Sexual activity: Yes    Birth control/protection: None  Other Topics Concern  . Not on file  Social History Narrative   Patient lives with family.   Caffeine Use: none   Social Determinants of Health   Financial Resource Strain:   . Difficulty of Paying Living Expenses:   Food Insecurity:   . Worried About Programme researcher, broadcasting/film/video in the Last Year:   . Barista in the Last Year:   Transportation Needs:   . Freight forwarder (Medical):   Marland Kitchen Lack of Transportation (Non-Medical):   Physical Activity:   . Days of Exercise per Week:   . Minutes of Exercise per Session:   Stress:   . Feeling of Stress :   Social Connections:   . Frequency of Communication with Friends and Family:   .  Frequency of Social Gatherings with Friends and Family:   . Attends Religious Services:   . Active Member of Clubs or Organizations:   . Attends Banker Meetings:   Marland Kitchen Marital Status:   Intimate Partner Violence:   . Fear of Current or Ex-Partner:   . Emotionally Abused:   Marland Kitchen Physically Abused:   . Sexually Abused:     Outpatient Medications Prior to Visit  Medication Sig Dispense Refill  . cloNIDine (CATAPRES) 0.1 MG tablet Take 1 tablet (0.1 mg total) by mouth 2 (two) times daily. 60 tablet 5  . hydrochlorothiazide (HYDRODIURIL) 25 MG tablet Take 1 tablet (25 mg total) by mouth daily. 30 tablet 5  . meloxicam (MOBIC) 15 MG tablet Take 1 tablet (15 mg total) by mouth daily.  30 tablet 2  . omeprazole (PRILOSEC) 40 MG capsule Take 1 capsule (40 mg total) by mouth daily. 30 capsule 5  . cyclobenzaprine (FLEXERIL) 5 MG tablet TAKE 1 TABLET (5 MG TOTAL) BY MOUTH 3 (THREE) TIMES DAILY AS NEEDED FOR MUSCLE SPASMS. 30 tablet 1   No facility-administered medications prior to visit.    No Known Allergies  ROS Review of Systems  Constitutional: Negative.   HENT: Negative.   Eyes: Negative.   Respiratory: Negative.   Cardiovascular: Negative.   Gastrointestinal: Negative.   Endocrine: Negative.   Genitourinary: Negative.   Musculoskeletal: Positive for arthralgias (generalized).  Skin: Negative.   Allergic/Immunologic: Negative.   Neurological: Positive for dizziness (occasional ) and headaches (occasional ).  Hematological: Negative.   Psychiatric/Behavioral: Negative.       Objective:    Physical Exam  Constitutional: She is oriented to person, place, and time. She appears well-developed and well-nourished.  HENT:  Head: Normocephalic and atraumatic.  Eyes: Conjunctivae are normal.  Cardiovascular: Normal rate, regular rhythm, normal heart sounds and intact distal pulses.  Pulmonary/Chest: Effort normal and breath sounds normal.  Abdominal: Soft. Bowel sounds are normal.  Musculoskeletal:        General: Normal range of motion.     Cervical back: Normal range of motion and neck supple.  Neurological: She is oriented to person, place, and time.  Skin: Skin is warm and dry.  Psychiatric: She has a normal mood and affect. Her behavior is normal. Judgment and thought content normal.  Nursing note and vitals reviewed.   BP 135/70   Pulse 69   Temp 98.1 F (36.7 C)   Ht 5' 4.5" (1.638 m)   Wt 161 lb 6.4 oz (73.2 kg)   SpO2 100%   BMI 27.28 kg/m  Wt Readings from Last 3 Encounters:  01/22/20 161 lb 6.4 oz (73.2 kg)  07/25/19 174 lb 6.4 oz (79.1 kg)  05/08/19 167 lb 3.2 oz (75.8 kg)     There are no preventive care reminders to display for  this patient.  There are no preventive care reminders to display for this patient.  Lab Results  Component Value Date   TSH 0.54 05/18/2016   Lab Results  Component Value Date   WBC 5.6 05/04/2018   HGB 12.3 05/04/2018   HCT 35.3 05/04/2018   MCV 87 05/04/2018   PLT 354 05/04/2018   Lab Results  Component Value Date   NA 136 05/08/2019   K 3.9 05/08/2019   CO2 27 05/08/2019   GLUCOSE 91 05/08/2019   BUN 16 05/08/2019   CREATININE 1.12 (H) 05/08/2019   BILITOT 0.3 05/08/2019   ALKPHOS 82 05/08/2019   AST 23  05/08/2019   ALT 21 05/08/2019   PROT 7.3 05/08/2019   ALBUMIN 4.3 05/08/2019   CALCIUM 9.6 05/08/2019   ANIONGAP 10 01/30/2016   Lab Results  Component Value Date   CHOL 207 (H) 11/07/2018   Lab Results  Component Value Date   HDL 70 11/07/2018   Lab Results  Component Value Date   LDLCALC 114 (H) 11/07/2018   Lab Results  Component Value Date   TRIG 115 11/07/2018   Lab Results  Component Value Date   CHOLHDL 3.0 11/07/2018   Lab Results  Component Value Date   HGBA1C 6.0 (A) 01/22/2020      Assessment & Plan:   1. Essential hypertension The current medical regimen is effective; blood pressure is stable at 135/70 today; continue present plan and medications as prescribed. She will continue to take medications as prescribed, to decrease high sodium intake, excessive alcohol intake, increase potassium intake, smoking cessation, and increase physical activity of at least 30 minutes of cardio activity daily. She will continue to follow Heart Healthy or DASH diet. - cloNIDine (CATAPRES) 0.1 MG tablet; Take 1 tablet (0.1 mg total) by mouth 2 (two) times daily.  Dispense: 60 tablet; Refill: 6 - hydrochlorothiazide (HYDRODIURIL) 25 MG tablet; Take 1 tablet (25 mg total) by mouth daily.  Dispense: 30 tablet; Refill: 6  2. Bilateral hand pain Resolved.   3. Numbness and tingling in both hands Resolved.   4. Chronic Left foot pain Unable to follow up  with Podiatry, because of financial reasons. She is encouraged to apply for [facility] Card).  - meloxicam (MOBIC) 15 MG tablet; Take 1 tablet (15 mg total) by mouth daily.  Dispense: 30 tablet; Refill: 6  5. Pain of left lower extremity We will increase dosage of Flexeril to 10 mg PRN today.  - cyclobenzaprine (FLEXERIL) 10 MG tablet; Take 1 tablet (10 mg total) by mouth 3 (three) times daily as needed for muscle spasms.  Dispense: 30 tablet; Refill: 6  6. Gastroesophageal reflux disease without esophagitis - omeprazole (PRILOSEC) 40 MG capsule; Take 1 capsule (40 mg total) by mouth daily.  Dispense: 30 capsule; Refill: 6  7. Healthcare maintenance - POCT urinalysis dipstick - POCT glycosylated hemoglobin (Hb A1C) - POCT glucose (manual entry) - CBC with Differential - Comprehensive metabolic panel - Lipid Panel - TSH - Vitamin B12 - Vitamin D, 25-hydroxy  8. Follow up She will follow up in 6 months.    Meds ordered this encounter  Medications  . cyclobenzaprine (FLEXERIL) 10 MG tablet    Sig: Take 1 tablet (10 mg total) by mouth 3 (three) times daily as needed for muscle spasms.    Dispense:  30 tablet    Refill:  6  . cloNIDine (CATAPRES) 0.1 MG tablet    Sig: Take 1 tablet (0.1 mg total) by mouth 2 (two) times daily.    Dispense:  60 tablet    Refill:  6  . hydrochlorothiazide (HYDRODIURIL) 25 MG tablet    Sig: Take 1 tablet (25 mg total) by mouth daily.    Dispense:  30 tablet    Refill:  6  . meloxicam (MOBIC) 15 MG tablet    Sig: Take 1 tablet (15 mg total) by mouth daily.    Dispense:  30 tablet    Refill:  6  . omeprazole (PRILOSEC) 40 MG capsule    Sig: Take 1 capsule (40 mg total) by mouth daily.    Dispense:  30 capsule    Refill:  6    Orders Placed This Encounter  Procedures  . CBC with Differential  . Comprehensive metabolic panel  . Lipid Panel  . TSH  . Vitamin B12  . Vitamin D, 25-hydroxy  . POCT  urinalysis dipstick  . POCT glycosylated hemoglobin (Hb A1C)  . POCT glucose (manual entry)    Referral Orders  No referral(s) requested today    Kathe Becton,  MSN, FNP-BC [place], [place] [address] [phone] fax   Problem List Items Addressed This Visit      Cardiovascular and Mediastinum   Essential hypertension - Primary   Relevant Medications   cloNIDine (CATAPRES) 0.1 MG tablet   hydrochlorothiazide (HYDRODIURIL) 25 MG tablet     Digestive   Gastroesophageal reflux disease without esophagitis   Relevant Medications   omeprazole (PRILOSEC) 40 MG capsule     Other   Bilateral hand pain   Numbness and tingling in both hands    Other Visit Diagnoses    Left foot pain       Relevant Medications   meloxicam (MOBIC) 15 MG tablet   Pain of left lower extremity       Relevant Medications   cyclobenzaprine (FLEXERIL) 10 MG tablet   Healthcare maintenance       Relevant Orders   POCT urinalysis dipstick   POCT glycosylated hemoglobin (Hb A1C) (Completed)   POCT glucose (manual entry) (Completed)   CBC with Differential   Comprehensive metabolic panel   Lipid Panel   TSH   Vitamin B12   Vitamin D, 25-hydroxy   Follow up          Meds ordered this encounter  Medications  . cyclobenzaprine (FLEXERIL) 10 MG tablet    Sig: Take 1 tablet (10 mg total) by mouth 3 (three) times daily as needed for muscle spasms.    Dispense:  30 tablet    Refill:  6  . cloNIDine (CATAPRES) 0.1 MG tablet    Sig: Take 1 tablet (0.1 mg total) by mouth 2 (two) times daily.    Dispense:  60 tablet    Refill:  6  . hydrochlorothiazide (HYDRODIURIL) 25 MG tablet    Sig: Take 1 tablet (25 mg total) by mouth daily.    Dispense:  30 tablet    Refill:  6  . meloxicam (MOBIC) 15 MG tablet    Sig: Take 1 tablet (15 mg total) by mouth daily.    Dispense:  30 tablet    Refill:   6  . omeprazole (PRILOSEC) 40 MG capsule    Sig: Take 1 capsule (40 mg total) by mouth daily.    Dispense:  30 capsule    Refill:  6    Follow-up: No follow-ups on file.    Azzie Glatter, FNP

## 2020-01-23 LAB — CBC WITH DIFFERENTIAL/PLATELET
Basophils Absolute: 0.1 10*3/uL (ref 0.0–0.2)
Basos: 1 %
EOS (ABSOLUTE): 0.1 10*3/uL (ref 0.0–0.4)
Eos: 2 %
Hematocrit: 35.3 % (ref 34.0–46.6)
Hemoglobin: 12.5 g/dL (ref 11.1–15.9)
Immature Grans (Abs): 0 10*3/uL (ref 0.0–0.1)
Immature Granulocytes: 0 %
Lymphocytes Absolute: 1.9 10*3/uL (ref 0.7–3.1)
Lymphs: 37 %
MCH: 30.9 pg (ref 26.6–33.0)
MCHC: 35.4 g/dL (ref 31.5–35.7)
MCV: 87 fL (ref 79–97)
Monocytes Absolute: 0.4 10*3/uL (ref 0.1–0.9)
Monocytes: 9 %
Neutrophils Absolute: 2.5 10*3/uL (ref 1.4–7.0)
Neutrophils: 51 %
Platelets: 354 10*3/uL (ref 150–450)
RBC: 4.05 x10E6/uL (ref 3.77–5.28)
RDW: 13 % (ref 11.7–15.4)
WBC: 5 10*3/uL (ref 3.4–10.8)

## 2020-01-23 LAB — COMPREHENSIVE METABOLIC PANEL
ALT: 41 IU/L — ABNORMAL HIGH (ref 0–32)
AST: 52 IU/L — ABNORMAL HIGH (ref 0–40)
Albumin/Globulin Ratio: 1.6 (ref 1.2–2.2)
Albumin: 4.5 g/dL (ref 3.8–4.9)
Alkaline Phosphatase: 91 IU/L (ref 39–117)
BUN/Creatinine Ratio: 19 (ref 9–23)
BUN: 15 mg/dL (ref 6–24)
Bilirubin Total: 0.6 mg/dL (ref 0.0–1.2)
CO2: 24 mmol/L (ref 20–29)
Calcium: 9.9 mg/dL (ref 8.7–10.2)
Chloride: 98 mmol/L (ref 96–106)
Creatinine, Ser: 0.77 mg/dL (ref 0.57–1.00)
GFR calc Af Amer: 100 mL/min/{1.73_m2} (ref >59)
GFR calc non Af Amer: 87 mL/min/{1.73_m2} (ref >59)
Globulin, Total: 2.9 g/dL (ref 1.5–4.5)
Glucose: 107 mg/dL — ABNORMAL HIGH (ref 65–99)
Potassium: 4.1 mmol/L (ref 3.5–5.2)
Sodium: 137 mmol/L (ref 134–144)
Total Protein: 7.4 g/dL (ref 6.0–8.5)

## 2020-01-23 LAB — LIPID PANEL
Chol/HDL Ratio: 2.3 ratio (ref 0.0–4.4)
Cholesterol, Total: 201 mg/dL — ABNORMAL HIGH (ref 100–199)
HDL: 87 mg/dL (ref >39)
LDL Chol Calc (NIH): 106 mg/dL — ABNORMAL HIGH (ref 0–99)
Triglycerides: 40 mg/dL (ref 0–149)
VLDL Cholesterol Cal: 8 mg/dL (ref 5–40)

## 2020-01-23 LAB — VITAMIN B12: Vitamin B-12: 1142 pg/mL (ref 232–1245)

## 2020-01-23 LAB — TSH: TSH: 0.55 u[IU]/mL (ref 0.450–4.500)

## 2020-01-23 LAB — VITAMIN D 25 HYDROXY (VIT D DEFICIENCY, FRACTURES): Vit D, 25-Hydroxy: 50.9 ng/mL (ref 30.0–100.0)

## 2020-02-05 MED FILL — OMEPRAZOLE DR 40 MG CAPSULE: 40 | 30 days supply | Qty: 30 | Fill #0

## 2020-02-05 MED FILL — ?HYDROCHLOROTHIAZIDE 25MG T: 25 | 30 days supply | Qty: 30 | Fill #0

## 2020-02-05 MED FILL — MELOXICAM 15 MG TABLET: 15 | 30 days supply | Qty: 30 | Fill #0

## 2020-02-05 MED FILL — ?cloNIDine HCL 0.1 MG TABS: 0.1 | 30 days supply | Qty: 60 | Fill #0

## 2020-03-05 MED FILL — CYCLOBENZAPRINE 10 MG TAB: 10 | 10 days supply | Qty: 30 | Fill #1

## 2020-03-05 MED FILL — OMEPRAZOLE DR 40 MG CAPSULE: 40 | 30 days supply | Qty: 30 | Fill #1

## 2020-03-05 MED FILL — HYDROCHLOROTHIAZIDE 25 MG T: 25 | 30 days supply | Qty: 30 | Fill #1

## 2020-03-05 MED FILL — MELOXICAM 15 MG TABLET: 15 | 30 days supply | Qty: 30 | Fill #1

## 2020-03-05 MED FILL — ?cloNIDine HCL 0.1 MG TABS: 0.1 | 30 days supply | Qty: 60 | Fill #1

## 2020-04-04 MED FILL — OMEPRAZOLE DR 40 MG CAPSULE: 40 | 30 days supply | Qty: 30 | Fill #2

## 2020-04-04 MED FILL — CYCLOBENZAPRINE 10 MG TAB: 10 | 10 days supply | Qty: 30 | Fill #2

## 2020-04-04 MED FILL — HYDROCHLOROTHIAZIDE 25 MG T: 25 | 30 days supply | Qty: 30 | Fill #2

## 2020-04-04 MED FILL — MELOXICAM 15 MG TABLET: 15 | 30 days supply | Qty: 30 | Fill #2

## 2020-04-04 MED FILL — ?cloNIDine HCL 0.1 MG TABS: 0.1 | 30 days supply | Qty: 60 | Fill #2

## 2020-05-07 MED FILL — ?cloNIDine HCL 0.1 MG TABS: 0.1 | 30 days supply | Qty: 60 | Fill #3

## 2020-05-07 MED FILL — MELOXICAM 15 MG TABLET: 15 | 30 days supply | Qty: 30 | Fill #3

## 2020-05-07 MED FILL — OMEPRAZOLE DR 40 MG CAPSULE: 40 | 30 days supply | Qty: 30 | Fill #3

## 2020-05-07 MED FILL — CYCLOBENZAPRINE 10 MG TAB: 10 | 10 days supply | Qty: 30 | Fill #3

## 2020-05-07 MED FILL — HYDROCHLOROTHIAZIDE 25 MG T: 25 | 30 days supply | Qty: 30 | Fill #3

## 2020-06-05 MED FILL — HYDROCHLOROTHIAZIDE 25 MG T: 25 | 30 days supply | Qty: 30 | Fill #4

## 2020-06-05 MED FILL — MELOXICAM 15 MG TABLET: 15 | 30 days supply | Qty: 30 | Fill #4

## 2020-06-05 MED FILL — OMEPRAZOLE DR 40 MG CAPSULE: 40 | 30 days supply | Qty: 30 | Fill #4

## 2020-06-05 MED FILL — ?cloNIDine HCL 0.1 MG TABS: 0.1 | 30 days supply | Qty: 60 | Fill #4

## 2020-07-04 MED FILL — CYCLOBENZAPRINE 10 MG TAB: 10 | 10 days supply | Qty: 30 | Fill #4

## 2020-07-04 MED FILL — MELOXICAM 15 MG TABLET: 15 | 30 days supply | Qty: 30 | Fill #5

## 2020-07-04 MED FILL — OMEPRAZOLE DR 40 MG CAPSULE: 40 | 30 days supply | Qty: 30 | Fill #5

## 2020-07-04 MED FILL — cloNIDine HCL 0.1 MG TABS: 0.1 | 30 days supply | Qty: 60 | Fill #5

## 2020-07-04 MED FILL — HYDROCHLOROTHIAZIDE 25 MG T: 25 | 30 days supply | Qty: 30 | Fill #5

## 2020-07-16 MED FILL — IBUPROFEN 600 MG TABLET: 600 | 6 days supply | Qty: 20 | Fill #0

## 2020-07-18 ENCOUNTER — Telehealth: Payer: Self-pay | Admitting: Family Medicine

## 2020-07-18 ENCOUNTER — Other Ambulatory Visit: Payer: Self-pay | Admitting: Family Medicine

## 2020-07-18 DIAGNOSIS — M79672 Pain in left foot: Secondary | ICD-10-CM

## 2020-07-18 MED ORDER — MELOXICAM 15 MG PO TABS: 15.0000 mg | ORAL_TABLET | Freq: Every day | ORAL | 6 refills | Status: DC

## 2020-07-18 NOTE — Telephone Encounter (Signed)
Patient has recently broken her shoulder and ortho provider put her on Ibuprofen 800mg  and she is concerned with taking Ibuprofen with her other meds. She would like to have a call to confirm that taking 800mg  Ibuprofen is safe to take with her other meds or should something be stopped while on Ibuprofen?

## 2020-07-19 MED FILL — METHOCARBAMOL 500 MG TABS: 500 | 10 days supply | Qty: 20 | Fill #0

## 2020-07-22 ENCOUNTER — Ambulatory Visit: Payer: No Typology Code available for payment source | Admitting: Nurse Practitioner

## 2020-08-05 ENCOUNTER — Other Ambulatory Visit: Payer: Self-pay

## 2020-08-05 ENCOUNTER — Encounter: Payer: Self-pay | Admitting: Nurse Practitioner

## 2020-08-05 ENCOUNTER — Ambulatory Visit (INDEPENDENT_AMBULATORY_CARE_PROVIDER_SITE_OTHER): Payer: Self-pay | Admitting: Nurse Practitioner

## 2020-08-05 ENCOUNTER — Other Ambulatory Visit: Payer: Self-pay | Admitting: Nurse Practitioner

## 2020-08-05 VITALS — BP 150/91 | HR 74 | Temp 97.2°F | Ht 64.5 in | Wt 161.0 lb

## 2020-08-05 DIAGNOSIS — M79605 Pain in left leg: Secondary | ICD-10-CM

## 2020-08-05 DIAGNOSIS — K219 Gastro-esophageal reflux disease without esophagitis: Secondary | ICD-10-CM

## 2020-08-05 DIAGNOSIS — I10 Essential (primary) hypertension: Secondary | ICD-10-CM

## 2020-08-05 DIAGNOSIS — F17200 Nicotine dependence, unspecified, uncomplicated: Secondary | ICD-10-CM

## 2020-08-05 DIAGNOSIS — Z124 Encounter for screening for malignant neoplasm of cervix: Secondary | ICD-10-CM

## 2020-08-05 DIAGNOSIS — M79672 Pain in left foot: Secondary | ICD-10-CM

## 2020-08-05 DIAGNOSIS — Z0000 Encounter for general adult medical examination without abnormal findings: Secondary | ICD-10-CM

## 2020-08-05 LAB — POCT GLYCOSYLATED HEMOGLOBIN (HGB A1C): Hemoglobin A1C: 5.9 % — AB (ref 4.0–5.6)

## 2020-08-05 MED ORDER — MELOXICAM 15 MG PO TABS: 15.0000 mg | ORAL_TABLET | Freq: Every day | ORAL | 11 refills | Status: DC

## 2020-08-05 MED ORDER — CLONIDINE HCL 0.1 MG PO TABS: 0.1000 mg | ORAL_TABLET | Freq: Two times a day (BID) | ORAL | 11 refills | Status: DC

## 2020-08-05 MED ORDER — HYDROCHLOROTHIAZIDE 25 MG PO TABS: 25.0000 mg | ORAL_TABLET | Freq: Every day | ORAL | 11 refills | Status: DC

## 2020-08-05 MED ORDER — OMEPRAZOLE 40 MG PO CPDR: 40.0000 mg | DELAYED_RELEASE_CAPSULE | Freq: Every day | ORAL | 11 refills | Status: DC

## 2020-08-05 MED FILL — MELOXICAM 15 MG TABLET: 15 | 30 days supply | Qty: 30 | Fill #0

## 2020-08-05 MED FILL — OMEPRAZOLE DR 40 MG CAPSULE: 40 | 30 days supply | Qty: 30 | Fill #0

## 2020-08-05 MED FILL — ?cloNIDine HCL 0.1 MG TABS: 0.1 | 30 days supply | Qty: 60 | Fill #0

## 2020-08-05 MED FILL — HYDROCHLOROTHIAZIDE 25 MG T: 25 | 30 days supply | Qty: 30 | Fill #0

## 2020-08-05 NOTE — Patient Instructions (Signed)
Dizziness Dizziness is a common problem. It makes you feel unsteady or light-headed. You may feel like you are about to pass out (faint). Dizziness can lead to getting hurt if you stumble or fall. Dizziness can be caused by many things, including:  Medicines.  Not having enough water in your body (dehydration).  Illness. Follow these instructions at home: Eating and drinking   Drink enough fluid to keep your pee (urine) clear or pale yellow. This helps to keep you from getting dehydrated. Try to drink more clear fluids, such as water.  Do not drink alcohol.  Limit how much caffeine you drink or eat, if your doctor tells you to do that.  Limit how much salt (sodium) you drink or eat, if your doctor tells you to do that. Activity   Avoid making quick movements. ? When you stand up from sitting in a chair, steady yourself until you feel okay. ? In the morning, first sit up on the side of the bed. When you feel okay, stand slowly while you hold onto something. Do this until you know that your balance is fine.  If you need to stand in one place for a long time, move your legs often. Tighten and relax the muscles in your legs while you are standing.  Do not drive or use heavy machinery if you feel dizzy.  Avoid bending down if you feel dizzy. Place items in your home so you can reach them easily without leaning over. Lifestyle  Do not use any products that contain nicotine or tobacco, such as cigarettes and e-cigarettes. If you need help quitting, ask your doctor.  Try to lower your stress level. You can do this by using methods such as yoga or meditation. Talk with your doctor if you need help. General instructions  Watch your dizziness for any changes.  Take over-the-counter and prescription medicines only as told by your doctor. Talk with your doctor if you think that you are dizzy because of a medicine that you are taking.  Tell a friend or a family member that you are feeling  dizzy. If he or she notices any changes in your behavior, have this person call your doctor.  Keep all follow-up visits as told by your doctor. This is important. Contact a doctor if:  Your dizziness does not go away.  Your dizziness or light-headedness gets worse.  You feel sick to your stomach (nauseous).  You have trouble hearing.  You have new symptoms.  You are unsteady on your feet.  You feel like the room is spinning. Get help right away if:  You throw up (vomit) or have watery poop (diarrhea), and you cannot eat or drink anything.  You have trouble: ? Talking. ? Walking. ? Swallowing. ? Using your arms, hands, or legs.  You feel generally weak.  You are not thinking clearly, or you have trouble forming sentences. A friend or family member may notice this.  You have: ? Chest pain. ? Pain in your belly (abdomen). ? Shortness of breath. ? Sweating.  Your vision changes.  You are bleeding.  You have a very bad headache.  You have neck pain or a stiff neck.  You have a fever. These symptoms may be an emergency. Do not wait to see if the symptoms will go away. Get medical help right away. Call your local emergency services (911 in the [place] Do not drive yourself to the hospital. Summary  Dizziness makes you feel unsteady or light-headed. You   may feel like you are about to pass out (faint).  Drink enough fluid to keep your pee (urine) clear or pale yellow. Do not drink alcohol.  Avoid making quick movements if you feel dizzy.  Watch your dizziness for any changes. This information is not intended to replace advice given to you by your health care provider. Make sure you discuss any questions you have with your health care provider. Document Revised: 10/22/2017 Document Reviewed: 11/05/2016 Elsevier Patient Education  2020 Elsevier Inc.   Prediabetes Eating Plan Prediabetes is a condition that causes blood sugar (glucose) levels to be higher than  normal. This increases the risk for developing diabetes. In order to prevent diabetes from developing, your health care provider may recommend a diet and other lifestyle changes to help you:  Control your blood glucose levels.  Improve your cholesterol levels.  Manage your blood pressure. Your health care provider may recommend working with a diet and nutrition specialist (dietitian) to make a meal plan that is best for you. What are tips for following this plan? Lifestyle  Set weight loss goals with the help of your health care team. It is recommended that most people with prediabetes lose 7% of their current body weight.  Exercise for at least 30 minutes at least 5 days a week.  Attend a support group or seek ongoing support from a mental health counselor.  Take over-the-counter and prescription medicines only as told by your health care provider. Reading food labels  Read food labels to check the amount of fat, salt (sodium), and sugar in prepackaged foods. Avoid foods that have: ? Saturated fats. ? Trans fats. ? Added sugars.  Avoid foods that have more than 300 milligrams (mg) of sodium per serving. Limit your daily sodium intake to less than 2,300 mg each day. Shopping  Avoid buying pre-made and processed foods. Cooking  Cook with olive oil. Do not use butter, lard, or ghee.  Bake, broil, grill, or boil foods. Avoid frying. Meal planning   Work with your dietitian to develop an eating plan that is right for you. This may include: ? Tracking how many calories you take in. Use a food diary, notebook, or mobile application to track what you eat at each meal. ? Using the glycemic index (GI) to plan your meals. The index tells you how quickly a food will raise your blood glucose. Choose low-GI foods. These foods take a longer time to raise blood glucose.  Consider following a Mediterranean diet. This diet includes: ? Several servings each day of fresh fruits and  vegetables. ? Eating fish at least twice a week. ? Several servings each day of whole grains, beans, nuts, and seeds. ? Using olive oil instead of other fats. ? Moderate alcohol consumption. ? Eating small amounts of red meat and whole-fat dairy.  If you have high blood pressure, you may need to limit your sodium intake or follow a diet such as the DASH eating plan. DASH is an eating plan that aims to lower high blood pressure. What foods are recommended? The items listed below may not be a complete list. Talk with your dietitian about what dietary choices are best for you. Grains Whole grains, such as whole-wheat or whole-grain breads, crackers, cereals, and pasta. Unsweetened oatmeal. Bulgur. Barley. Quinoa. Brown rice. Corn or whole-wheat flour tortillas or taco shells. Vegetables Lettuce. Spinach. Peas. Beets. Cauliflower. Cabbage. Broccoli. Carrots. Tomatoes. Squash. Eggplant. Herbs. Peppers. Onions. Cucumbers. Brussels sprouts. Fruits Berries. Bananas. Apples. Oranges. Grapes. Papaya.  Mango. Pomegranate. Kiwi. Grapefruit. Cherries. Meats and other protein foods Seafood. Poultry without skin. Lean cuts of pork and beef. Tofu. Eggs. Nuts. Beans. Dairy Low-fat or fat-free dairy products, such as yogurt, cottage cheese, and cheese. Beverages Water. Tea. Coffee. Sugar-free or diet soda. Seltzer water. Lowfat or no-fat milk. Milk alternatives, such as soy or almond milk. Fats and oils Olive oil. Canola oil. Sunflower oil. Grapeseed oil. Avocado. Walnuts. Sweets and desserts Sugar-free or low-fat pudding. Sugar-free or low-fat ice cream and other frozen treats. Seasoning and other foods Herbs. Sodium-free spices. Mustard. Relish. Low-fat, low-sugar ketchup. Low-fat, low-sugar barbecue sauce. Low-fat or fat-free mayonnaise. What foods are not recommended? The items listed below may not be a complete list. Talk with your dietitian about what dietary choices are best for  you. Grains Refined white flour and flour products, such as bread, pasta, snack foods, and cereals. Vegetables Canned vegetables. Frozen vegetables with butter or cream sauce. Fruits Fruits canned with syrup. Meats and other protein foods Fatty cuts of meat. Poultry with skin. Breaded or fried meat. Processed meats. Dairy Full-fat yogurt, cheese, or milk. Beverages Sweetened drinks, such as sweet iced tea and soda. Fats and oils Butter. Lard. Ghee. Sweets and desserts Baked goods, such as cake, cupcakes, pastries, cookies, and cheesecake. Seasoning and other foods Spice mixes with added salt. Ketchup. Barbecue sauce. Mayonnaise. Summary  To prevent diabetes from developing, you may need to make diet and other lifestyle changes to help control blood sugar, improve cholesterol levels, and manage your blood pressure.  Set weight loss goals with the help of your health care team. It is recommended that most people with prediabetes lose 7 percent of their current body weight.  Consider following a Mediterranean diet that includes plenty of fresh fruits and vegetables, whole grains, beans, nuts, seeds, fish, lean meat, low-fat dairy, and healthy oils. This information is not intended to replace advice given to you by your health care provider. Make sure you discuss any questions you have with your health care provider. Document Revised: 02/10/2019 Document Reviewed: 12/23/2016 Elsevier Patient Education  2020 [employer].

## 2020-08-05 NOTE — Progress Notes (Signed)
[facility] Patient [facility] [address] [place], [place]  [address] Phone:  [phone]   Fax:  [phone]   Established Patient Office Visit  Subjective:  Patient ID: Nina Clark, female    DOB: 05-26-1966  Age: 54 y.o. MRN: [id number]  CC:  Chief Complaint  Patient presents with   Follow-up    HPI Lenna Hagarty presents for follow up. She  has a past medical history of Carpal tunnel syndrome on both sides, Chronic foot pain, left, History of vertigo, Hyperglycemia (01/3020), Hypertension, and Prediabetes (01/2020).   Hypertension Patient is here for follow-up of elevated blood pressure. She is exercising and is not adherent to a low-salt diet. Blood pressure is not monitored at home. Cardiac symptoms: none. Patient denies chest pain, dyspnea, irregular heart beat, lower extremity edema, palpitations and syncope. Cardiovascular risk factors: hypertension and smoking/ tobacco exposure. Use of agents associated with hypertension: none. History of target organ damage: none. Past Medical History:  Diagnosis Date   Carpal tunnel syndrome on both sides    Chronic foot pain, left    History of vertigo    Hyperglycemia 01/3020   Hypertension    Prediabetes 01/2020    Past Surgical History:  Procedure Laterality Date   EYE SURGERY      Family History  Problem Relation Age of Onset   Hypertension Mother    Hypertension Father    Diabetes Father    Diabetes Sister     Social History   Socioeconomic History   Marital status: Single    Spouse name: Not on file   Number of children: 1   Years of education: College   Highest education level: Not on file  Occupational History    Employer: OTHER    Comment: [employer]  Tobacco Use   Smoking status: Current Every Day Smoker    Packs/day: 0.25    Years: 25.00    Pack years: 6.25    Types: Cigarettes   Smokeless tobacco: Never Used  Building services engineer Use: Never assessed  Substance and Sexual  Activity   Alcohol use: No    Alcohol/week: 0.0 standard drinks   Drug use: Yes    Types: Marijuana    Comment: daily   Sexual activity: Yes    Birth control/protection: None  Other Topics Concern   Not on file  Social History Narrative   Patient lives with family.   Caffeine Use: none   Social Determinants of Corporate investment banker Strain:    Difficulty of Paying Living Expenses: Not on file  Food Insecurity:    Worried About Programme researcher, broadcasting/film/video in the Last Year: Not on file   [employer] of Food in the Last Year: Not on file  Transportation Needs:    Lack of Transportation (Medical): Not on file   Lack of Transportation (Non-Medical): Not on file  Physical Activity:    Days of Exercise per Week: Not on file   Minutes of Exercise per Session: Not on file  Stress:    Feeling of Stress : Not on file  Social Connections:    Frequency of Communication with Friends and Family: Not on file   Frequency of Social Gatherings with Friends and Family: Not on file   Attends Religious Services: Not on file   Active Member of Clubs or Organizations: Not on file   Attends Banker Meetings: Not on file   Marital Status: Not on file  Intimate Partner Violence:    Fear of Current or Ex-Partner: Not on file   Emotionally Abused: Not on file   Physically Abused: Not on file   Sexually Abused: Not on file    Outpatient Medications Prior to Visit  Medication Sig Dispense Refill   cyclobenzaprine (FLEXERIL) 10 MG tablet Take 1 tablet (10 mg total) by mouth 3 (three) times daily as needed for muscle spasms. 30 tablet 6   cloNIDine (CATAPRES) 0.1 MG tablet Take 1 tablet (0.1 mg total) by mouth 2 (two) times daily. 60 tablet 6   hydrochlorothiazide (HYDRODIURIL) 25 MG tablet Take 1 tablet (25 mg total) by mouth daily. 30 tablet 6   HYDROcodone-acetaminophen (NORCO/VICODIN) 5-325 MG tablet Take 1 tablet by mouth every 6 (six) hours as needed for moderate  pain.     ibuprofen (ADVIL) 600 MG tablet Take 600 mg by mouth every 6 (six) hours as needed.     meloxicam (MOBIC) 15 MG tablet Take 1 tablet (15 mg total) by mouth daily. (Take if Motrin is not effective) 30 tablet 6   omeprazole (PRILOSEC) 40 MG capsule Take 1 capsule (40 mg total) by mouth daily. 30 capsule 6   No facility-administered medications prior to visit.    Allergies  Allergen Reactions   Ibuprofen Shortness Of Breath    ROS Review of Systems  Eyes: Visual disturbance: wears glasses.      Objective:    Physical Exam HENT:     Head: Normocephalic and atraumatic.     Nose: Nose normal.     Mouth/Throat:     Mouth: Mucous membranes are moist.  Cardiovascular:     Rate and Rhythm: Normal rate.     Pulses: Normal pulses.     Heart sounds: Normal heart sounds.  Pulmonary:     Effort: Pulmonary effort is normal.     Breath sounds: Normal breath sounds.  Abdominal:     General: Bowel sounds are normal.     Palpations: Abdomen is soft.  Musculoskeletal:     Cervical back: Normal range of motion.     Comments: Sling to right arm hx of right shoulder fracture. Able to wiggle hands; acitve  Skin:    General: Skin is warm and dry.     Capillary Refill: Capillary refill takes less than 2 seconds.  Neurological:     General: No focal deficit present.     Mental Status: She is alert and oriented to person, place, and time.  Psychiatric:        Mood and Affect: Mood normal.        Behavior: Behavior normal.        Thought Content: Thought content normal.        Judgment: Judgment normal.     BP (!) 150/91    Pulse 74    Temp (!) 97.2 F (36.2 C) (Temporal)    Ht 5' 4.5" (1.638 m)    Wt 161 lb (73 kg)    SpO2 99%    BMI 27.21 kg/m  Wt Readings from Last 3 Encounters:  08/05/20 161 lb (73 kg)  01/22/20 161 lb 6.4 oz (73.2 kg)  07/25/19 174 lb 6.4 oz (79.1 kg)     Health Maintenance Due  Topic Date Due   PAP SMEAR-Modifier  05/31/2020    There are no  preventive care reminders to display for this patient.  Lab Results  Component Value Date   TSH 0.550 01/22/2020   Lab Results  Component Value Date   WBC 5.0 01/22/2020   HGB 12.5 01/22/2020   HCT 35.3 01/22/2020   MCV 87 01/22/2020   PLT 354 01/22/2020   Lab Results  Component Value Date   NA 135 08/05/2020   K 3.7 08/05/2020   CO2 24 01/22/2020   GLUCOSE 93 08/05/2020   BUN 15 08/05/2020   CREATININE 0.80 08/05/2020   BILITOT 0.6 08/05/2020   ALKPHOS 94 08/05/2020   AST 27 08/05/2020   ALT 41 (H) 01/22/2020   PROT 7.8 08/05/2020   ALBUMIN 4.6 08/05/2020   CALCIUM 10.0 08/05/2020   ANIONGAP 10 01/30/2016   Lab Results  Component Value Date   CHOL 201 (H) 01/22/2020   Lab Results  Component Value Date   HDL 87 01/22/2020   Lab Results  Component Value Date   LDLCALC 106 (H) 01/22/2020   Lab Results  Component Value Date   TRIG 40 01/22/2020   Lab Results  Component Value Date   CHOLHDL 2.3 01/22/2020   Lab Results  Component Value Date   HGBA1C 5.9 (A) 08/05/2020      Assessment & Plan:   Problem List Items Addressed This Visit      Cardiovascular and Mediastinum   Essential hypertension - Primary Encouraged on going compliance with current medication regimen Encouraged home monitoring and recording BP <130/80 Eating a heart-healthy diet with less salt Encouraged regular physical activity  Encouraged smoking cessation   Relevant Medications   cloNIDine (CATAPRES) 0.1 MG tablet   hydrochlorothiazide (HYDRODIURIL) 25 MG tablet   Other Relevant Orders   Comp. Metabolic Panel (12)     Digestive   Gastroesophageal reflux disease without esophagitis   Relevant Medications   omeprazole (PRILOSEC) 40 MG capsule     Other   Left foot pain   Relevant Medications   meloxicam (MOBIC) 15 MG tablet   Pain of left lower extremity   Tobacco dependence    Other Visit Diagnoses    Screening for cervical cancer       Relevant Orders   Ambulatory  Referral to [facility]      Meds ordered this encounter  Medications   cloNIDine (CATAPRES) 0.1 MG tablet    Sig: Take 1 tablet (0.1 mg total) by mouth 2 (two) times daily.    Dispense:  60 tablet    Refill:  11   meloxicam (MOBIC) 15 MG tablet    Sig: Take 1 tablet (15 mg total) by mouth daily. (Take if Motrin is not effective)    Dispense:  30 tablet    Refill:  11   hydrochlorothiazide (HYDRODIURIL) 25 MG tablet    Sig: Take 1 tablet (25 mg total) by mouth daily.    Dispense:  30 tablet    Refill:  11   omeprazole (PRILOSEC) 40 MG capsule    Sig: Take 1 capsule (40 mg total) by mouth daily.    Dispense:  30 capsule    Refill:  11    Follow-up: Return in about 6 months (around 02/03/2021).    Barbette Merino, NP

## 2020-08-06 ENCOUNTER — Encounter: Payer: Self-pay | Admitting: Nurse Practitioner

## 2020-08-06 LAB — COMP. METABOLIC PANEL (12)
AST: 27 IU/L (ref 0–40)
Albumin/Globulin Ratio: 1.4 (ref 1.2–2.2)
Albumin: 4.6 g/dL (ref 3.8–4.9)
Alkaline Phosphatase: 94 IU/L (ref 44–121)
BUN/Creatinine Ratio: 19 (ref 9–23)
BUN: 15 mg/dL (ref 6–24)
Bilirubin Total: 0.6 mg/dL (ref 0.0–1.2)
Calcium: 10 mg/dL (ref 8.7–10.2)
Chloride: 98 mmol/L (ref 96–106)
Creatinine, Ser: 0.8 mg/dL (ref 0.57–1.00)
GFR calc Af Amer: 95 mL/min/{1.73_m2} (ref >59)
GFR calc non Af Amer: 83 mL/min/{1.73_m2} (ref >59)
Globulin, Total: 3.2 g/dL (ref 1.5–4.5)
Glucose: 93 mg/dL (ref 65–99)
Potassium: 3.7 mmol/L (ref 3.5–5.2)
Sodium: 135 mmol/L (ref 134–144)
Total Protein: 7.8 g/dL (ref 6.0–8.5)

## 2020-08-12 MED FILL — ?METHOCARBAMOL 500 MG TABLE: 500 | 10 days supply | Qty: 20 | Fill #1

## 2020-09-05 ENCOUNTER — Other Ambulatory Visit: Payer: Self-pay | Admitting: Specialist

## 2020-09-05 MED FILL — ?cloNIDine HCL 0.1 MG TABS: 0.1 | 30 days supply | Qty: 60 | Fill #1

## 2020-09-05 MED FILL — HYDROCHLOROTHIAZIDE 25 MG T: 25 | 30 days supply | Qty: 30 | Fill #1

## 2020-09-05 MED FILL — OMEPRAZOLE DR 40 MG CAPSULE: 40 | 30 days supply | Qty: 30 | Fill #1

## 2020-09-05 MED FILL — METHOCARBAMOL 500 MG TABS: 500 | 20 days supply | Qty: 40 | Fill #0

## 2020-09-05 MED FILL — MELOXICAM 15 MG TABLET: 15 | 30 days supply | Qty: 30 | Fill #1

## 2020-10-04 MED FILL — ?cloNIDine HCL 0.1 MG TABS: 0.1 | 30 days supply | Qty: 60 | Fill #2

## 2020-10-04 MED FILL — HYDROCHLOROTHIAZIDE 25 MG T: 25 | 30 days supply | Qty: 30 | Fill #2

## 2020-10-04 MED FILL — MELOXICAM 15 MG TABLET: 15 | 30 days supply | Qty: 30 | Fill #2

## 2020-10-04 MED FILL — OMEPRAZOLE DR 40 MG CAPSULE: 40 | 30 days supply | Qty: 30 | Fill #2

## 2020-11-05 MED FILL — HYDROCHLOROTHIAZIDE 25 MG T: 25 | 30 days supply | Qty: 30 | Fill #3

## 2020-11-05 MED FILL — OMEPRAZOLE DR 40 MG CAPSULE: 40 | 30 days supply | Qty: 30 | Fill #3

## 2020-11-05 MED FILL — ?cloNIDine HCL 0.1 MG TABS: 0.1 | 30 days supply | Qty: 60 | Fill #3

## 2020-11-05 MED FILL — MELOXICAM 15 MG TABLET: 15 | 30 days supply | Qty: 30 | Fill #3

## 2020-12-06 MED FILL — ?cloNIDine HCL 0.1 MG TABS: 0.1 | 30 days supply | Qty: 60 | Fill #4

## 2020-12-06 MED FILL — OMEPRAZOLE DR 40 MG CAPSULE: 40 | 30 days supply | Qty: 30 | Fill #4

## 2020-12-06 MED FILL — MELOXICAM 15 MG TABLET: 15 | 30 days supply | Qty: 30 | Fill #4

## 2020-12-06 MED FILL — HYDROCHLOROTHIAZIDE 25 MG T: 25 | 30 days supply | Qty: 30 | Fill #4

## 2021-01-07 MED FILL — OMEPRAZOLE DR 40 MG CAPSULE: 40 | 30 days supply | Qty: 30 | Fill #5

## 2021-01-07 MED FILL — MELOXICAM 15 MG TABLET: 15 | 30 days supply | Qty: 30 | Fill #5

## 2021-01-07 MED FILL — cloNIDine HCL 0.1 MG TABS: 0.1 | 30 days supply | Qty: 60 | Fill #5

## 2021-01-07 MED FILL — HYDROCHLOROTHIAZIDE 25 MG T: 25 | 30 days supply | Qty: 30 | Fill #5

## 2021-02-03 ENCOUNTER — Ambulatory Visit: Payer: No Typology Code available for payment source | Admitting: Nurse Practitioner

## 2021-02-05 ENCOUNTER — Other Ambulatory Visit: Payer: Self-pay

## 2021-02-05 MED FILL — Meloxicam Tab 15 MG: ORAL | 30 days supply | Qty: 30 | Fill #0 | Status: AC

## 2021-02-05 MED FILL — Omeprazole Cap Delayed Release 40 MG: ORAL | 30 days supply | Qty: 30 | Fill #0 | Status: AC

## 2021-02-05 MED FILL — Hydrochlorothiazide Tab 25 MG: ORAL | 30 days supply | Qty: 30 | Fill #0 | Status: AC

## 2021-02-06 ENCOUNTER — Other Ambulatory Visit: Payer: Self-pay

## 2021-02-06 MED FILL — Clonidine HCl Tab 0.1 MG: ORAL | 30 days supply | Qty: 60 | Fill #0 | Status: AC

## 2021-02-12 ENCOUNTER — Other Ambulatory Visit: Payer: Self-pay

## 2021-03-06 ENCOUNTER — Other Ambulatory Visit: Payer: Self-pay

## 2021-03-06 MED FILL — Omeprazole Cap Delayed Release 40 MG: ORAL | 30 days supply | Qty: 30 | Fill #1 | Status: AC

## 2021-03-06 MED FILL — Hydrochlorothiazide Tab 25 MG: ORAL | 30 days supply | Qty: 30 | Fill #1 | Status: AC

## 2021-03-06 MED FILL — Clonidine HCl Tab 0.1 MG: ORAL | 30 days supply | Qty: 60 | Fill #1 | Status: AC

## 2021-03-06 MED FILL — Meloxicam Tab 15 MG: ORAL | 30 days supply | Qty: 30 | Fill #1 | Status: AC

## 2021-04-07 ENCOUNTER — Other Ambulatory Visit: Payer: Self-pay

## 2021-04-07 MED ORDER — AMOXICILLIN-POT CLAVULANATE 500-125 MG PO TABS
ORAL_TABLET | ORAL | 0 refills | Status: DC
Start: 2021-04-07 — End: 2021-08-20
  Filled 2021-04-07: qty 14, 7d supply, fill #0

## 2021-04-07 MED FILL — Meloxicam Tab 15 MG: ORAL | 30 days supply | Qty: 30 | Fill #2 | Status: AC

## 2021-04-07 MED FILL — Clonidine HCl Tab 0.1 MG: ORAL | 30 days supply | Qty: 60 | Fill #2 | Status: AC

## 2021-04-07 MED FILL — Hydrochlorothiazide Tab 25 MG: ORAL | 30 days supply | Qty: 30 | Fill #2 | Status: AC

## 2021-04-07 MED FILL — Omeprazole Cap Delayed Release 40 MG: ORAL | 30 days supply | Qty: 30 | Fill #2 | Status: AC

## 2021-04-08 ENCOUNTER — Other Ambulatory Visit: Payer: Self-pay

## 2021-05-07 ENCOUNTER — Other Ambulatory Visit: Payer: Self-pay

## 2021-05-07 MED FILL — Meloxicam Tab 15 MG: ORAL | 30 days supply | Qty: 30 | Fill #3 | Status: AC

## 2021-05-07 MED FILL — Omeprazole Cap Delayed Release 40 MG: ORAL | 30 days supply | Qty: 30 | Fill #3 | Status: AC

## 2021-05-07 MED FILL — Hydrochlorothiazide Tab 25 MG: ORAL | 30 days supply | Qty: 30 | Fill #3 | Status: AC

## 2021-05-07 MED FILL — Clonidine HCl Tab 0.1 MG: ORAL | 30 days supply | Qty: 60 | Fill #3 | Status: AC

## 2021-05-08 ENCOUNTER — Other Ambulatory Visit: Payer: Self-pay

## 2021-06-04 MED FILL — Meloxicam Tab 15 MG: ORAL | 30 days supply | Qty: 30 | Fill #4 | Status: AC

## 2021-06-04 MED FILL — Omeprazole Cap Delayed Release 40 MG: ORAL | 30 days supply | Qty: 30 | Fill #4 | Status: AC

## 2021-06-04 MED FILL — Hydrochlorothiazide Tab 25 MG: ORAL | 30 days supply | Qty: 30 | Fill #4 | Status: AC

## 2021-06-04 MED FILL — Clonidine HCl Tab 0.1 MG: ORAL | 30 days supply | Qty: 60 | Fill #4 | Status: AC

## 2021-06-05 ENCOUNTER — Other Ambulatory Visit: Payer: Self-pay

## 2021-07-03 ENCOUNTER — Other Ambulatory Visit: Payer: Self-pay

## 2021-07-03 MED FILL — Meloxicam Tab 15 MG: ORAL | 30 days supply | Qty: 30 | Fill #5 | Status: AC

## 2021-07-03 MED FILL — Omeprazole Cap Delayed Release 40 MG: ORAL | 30 days supply | Qty: 30 | Fill #5 | Status: AC

## 2021-07-03 MED FILL — Hydrochlorothiazide Tab 25 MG: ORAL | 30 days supply | Qty: 30 | Fill #5 | Status: AC

## 2021-07-03 MED FILL — Clonidine HCl Tab 0.1 MG: ORAL | 30 days supply | Qty: 60 | Fill #5 | Status: AC

## 2021-07-30 ENCOUNTER — Telehealth: Payer: Self-pay

## 2021-07-30 ENCOUNTER — Other Ambulatory Visit: Payer: Self-pay

## 2021-07-30 DIAGNOSIS — K219 Gastro-esophageal reflux disease without esophagitis: Secondary | ICD-10-CM

## 2021-07-30 DIAGNOSIS — I10 Essential (primary) hypertension: Secondary | ICD-10-CM

## 2021-07-30 MED ORDER — CLONIDINE HCL 0.1 MG PO TABS
ORAL_TABLET | ORAL | 11 refills | Status: DC
  Filled 2021-07-30: qty 60, 30d supply, fill #0
  Filled 2021-09-01: qty 60, 30d supply, fill #1
  Filled 2021-10-02: qty 60, 30d supply, fill #2
  Filled 2021-11-03: qty 60, 30d supply, fill #3
  Filled 2021-12-02: qty 60, 30d supply, fill #0
  Filled 2021-12-02: qty 60, 30d supply, fill #4
  Filled 2021-12-31: qty 60, 30d supply, fill #1

## 2021-07-30 MED ORDER — HYDROCHLOROTHIAZIDE 25 MG PO TABS
ORAL_TABLET | Freq: Every day | ORAL | 11 refills | Status: DC
  Filled 2021-07-30: qty 30, 30d supply, fill #0
  Filled 2021-09-01: qty 30, 30d supply, fill #1
  Filled 2021-10-02: qty 30, 30d supply, fill #2
  Filled 2021-11-03: qty 30, 30d supply, fill #3
  Filled 2021-12-02: qty 30, 30d supply, fill #0
  Filled 2021-12-02: qty 30, 30d supply, fill #4
  Filled 2021-12-31: qty 30, 30d supply, fill #1

## 2021-07-30 MED ORDER — OMEPRAZOLE 40 MG PO CPDR
DELAYED_RELEASE_CAPSULE | Freq: Every day | ORAL | 11 refills | Status: DC
  Filled 2021-07-30: qty 30, 30d supply, fill #0
  Filled 2021-09-01: qty 30, 30d supply, fill #1
  Filled 2021-10-02: qty 30, 30d supply, fill #2
  Filled 2021-11-03: qty 30, 30d supply, fill #3
  Filled 2021-12-02: qty 30, 30d supply, fill #4
  Filled 2021-12-02: qty 30, 30d supply, fill #0
  Filled 2021-12-31: qty 30, 30d supply, fill #1
  Filled 2022-01-28: qty 30, 30d supply, fill #2

## 2021-07-30 NOTE — Telephone Encounter (Signed)
Clonidine  Hyrochlorothiazide Meloxicam Omeprazole

## 2021-07-30 NOTE — Telephone Encounter (Signed)
Refills sent

## 2021-07-31 ENCOUNTER — Other Ambulatory Visit: Payer: Self-pay | Admitting: Nurse Practitioner

## 2021-07-31 ENCOUNTER — Ambulatory Visit: Payer: No Typology Code available for payment source | Admitting: Nurse Practitioner

## 2021-07-31 ENCOUNTER — Other Ambulatory Visit: Payer: Self-pay

## 2021-07-31 DIAGNOSIS — M79672 Pain in left foot: Secondary | ICD-10-CM

## 2021-08-01 NOTE — Telephone Encounter (Signed)
Meloxcicam

## 2021-08-04 ENCOUNTER — Other Ambulatory Visit: Payer: Self-pay

## 2021-08-04 MED ORDER — MELOXICAM 15 MG PO TABS
ORAL_TABLET | ORAL | 11 refills | Status: AC
Start: 2021-08-04 — End: 2022-08-04
  Filled 2021-08-04: qty 30, 30d supply, fill #0
  Filled 2021-09-01: qty 30, 30d supply, fill #1
  Filled 2021-10-02: qty 30, 30d supply, fill #2
  Filled 2021-11-03: qty 30, 30d supply, fill #3
  Filled 2021-12-02: qty 30, 30d supply, fill #4
  Filled 2021-12-02: qty 30, 30d supply, fill #0
  Filled 2021-12-31 (×2): qty 30, 30d supply, fill #1
  Filled 2022-01-28: qty 30, 30d supply, fill #2
  Filled 2022-03-01: qty 30, 30d supply, fill #3
  Filled 2022-04-28: qty 30, 30d supply, fill #4

## 2021-08-20 ENCOUNTER — Other Ambulatory Visit: Payer: Self-pay

## 2021-08-20 ENCOUNTER — Encounter: Payer: Self-pay | Admitting: Nurse Practitioner

## 2021-08-20 ENCOUNTER — Ambulatory Visit (INDEPENDENT_AMBULATORY_CARE_PROVIDER_SITE_OTHER): Payer: Self-pay | Admitting: Nurse Practitioner

## 2021-08-20 VITALS — BP 167/115 | HR 58 | Temp 97.7°F | Ht 64.5 in | Wt 165.8 lb

## 2021-08-20 DIAGNOSIS — Z13220 Encounter for screening for lipoid disorders: Secondary | ICD-10-CM

## 2021-08-20 DIAGNOSIS — Z131 Encounter for screening for diabetes mellitus: Secondary | ICD-10-CM

## 2021-08-20 DIAGNOSIS — I10 Essential (primary) hypertension: Secondary | ICD-10-CM

## 2021-08-20 DIAGNOSIS — R7303 Prediabetes: Secondary | ICD-10-CM

## 2021-08-20 DIAGNOSIS — Z124 Encounter for screening for malignant neoplasm of cervix: Secondary | ICD-10-CM

## 2021-08-20 LAB — POCT GLYCOSYLATED HEMOGLOBIN (HGB A1C): Hemoglobin A1C: 6 % — AB (ref 4.0–5.6)

## 2021-08-20 NOTE — Patient Instructions (Signed)
Managing Your Hypertension Hypertension, also called high blood pressure, is when the force of the blood pressing against the walls of the arteries is too strong. Arteries are blood vessels that carry blood from your heart throughout your body. Hypertension forces the heart to work harder to pump blood and may cause the arteries to become narrow or stiff. Understanding blood pressure readings Your personal target blood pressure may vary depending on your medical conditions, your age, and other factors. A blood pressure reading includes a higher number over a lower number. Ideally, your blood pressure should be below 120/80. You should know that: The first, or top, number is called the systolic pressure. It is a measure of the pressure in your arteries as your heart beats. The second, or bottom number, is called the diastolic pressure. It is a measure of the pressure in your arteries as the heart relaxes. Blood pressure is classified into four stages. Based on your blood pressure reading, your health care provider may use the following stages to determine what type of treatment you need, if any. Systolic pressure and diastolic pressure are measured in a unit called mmHg. Normal Systolic pressure: below 120. Diastolic pressure: below 80. Elevated Systolic pressure: 120-129. Diastolic pressure: below 80. Hypertension stage 1 Systolic pressure: 130-139. Diastolic pressure: 80-89. Hypertension stage 2 Systolic pressure: 140 or above. Diastolic pressure: 90 or above. How can this condition affect me? Managing your hypertension is an important responsibility. Over time, hypertension can damage the arteries and decrease blood flow to important parts of the body, including the brain, heart, and kidneys. Having untreated or uncontrolled hypertension can lead to: A heart attack. A stroke. A weakened blood vessel (aneurysm). Heart failure. Kidney damage. Eye damage. Metabolic syndrome. Memory and  concentration problems. Vascular dementia. What actions can I take to manage this condition? Hypertension can be managed by making lifestyle changes and possibly by taking medicines. Your health care provider will help you make a plan to bring your blood pressure within a normal range. Nutrition  Eat a diet that is high in fiber and potassium, and low in salt (sodium), added sugar, and fat. An example eating plan is called the Dietary Approaches to Stop Hypertension (DASH) diet. To eat this way: Eat plenty of fresh fruits and vegetables. Try to fill one-half of your plate at each meal with fruits and vegetables. Eat whole grains, such as whole-wheat pasta, brown rice, or whole-grain bread. Fill about one-fourth of your plate with whole grains. Eat low-fat dairy products. Avoid fatty cuts of meat, processed or cured meats, and poultry with skin. Fill about one-fourth of your plate with lean proteins such as fish, chicken without skin, beans, eggs, and tofu. Avoid pre-made and processed foods. These tend to be higher in sodium, added sugar, and fat. Reduce your daily sodium intake. Most people with hypertension should eat less than 1,500 mg of sodium a day. Lifestyle  Work with your health care provider to maintain a healthy body weight or to lose weight. Ask what an ideal weight is for you. Get at least 30 minutes of exercise that causes your heart to beat faster (aerobic exercise) most days of the week. Activities may include walking, swimming, or biking. Include exercise to strengthen your muscles (resistance exercise), such as weight lifting, as part of your weekly exercise routine. Try to do these types of exercises for 30 minutes at least 3 days a week. Do not use any products that contain nicotine or tobacco, such as cigarettes, e-cigarettes,   and chewing tobacco. If you need help quitting, ask your health care provider. Control any long-term (chronic) conditions you have, such as high  cholesterol or diabetes. Identify your sources of stress and find ways to manage stress. This may include meditation, deep breathing, or making time for fun activities. Alcohol use Do not drink alcohol if: Your health care provider tells you not to drink. You are pregnant, may be pregnant, or are planning to become pregnant. If you drink alcohol: Limit how much you use to: 0-1 drink a day for women. 0-2 drinks a day for men. Be aware of how much alcohol is in your drink. In the [place] one drink equals one 12 oz bottle of beer (355 mL), one 5 oz glass of wine (148 mL), or one 1 oz glass of hard liquor (44 mL). Medicines Your health care provider may prescribe medicine if lifestyle changes are not enough to get your blood pressure under control and if: Your systolic blood pressure is 130 or higher. Your diastolic blood pressure is 80 or higher. Take medicines only as told by your health care provider. Follow the directions carefully. Blood pressure medicines must be taken as told by your health care provider. The medicine does not work as well when you skip doses. Skipping doses also puts you at risk for problems. Monitoring Before you monitor your blood pressure: Do not smoke, drink caffeinated beverages, or exercise within 30 minutes before taking a measurement. Use the bathroom and empty your bladder (urinate). Sit quietly for at least 5 minutes before taking measurements. Monitor your blood pressure at home as told by your health care provider. To do this: Sit with your back straight and supported. Place your feet flat on the floor. Do not cross your legs. Support your arm on a flat surface, such as a table. Make sure your upper arm is at heart level. Each time you measure, take two or three readings one minute apart and record the results. You may also need to have your blood pressure checked regularly by your health care provider. General information Talk with your health care  provider about your diet, exercise habits, and other lifestyle factors that may be contributing to hypertension. Review all the medicines you take with your health care provider because there may be side effects or interactions. Keep all visits as told by your health care provider. Your health care provider can help you create and adjust your plan for managing your high blood pressure. Where to find more information National Heart, Lung, and Blood Institute: www.nhlbi.nih.gov American Heart Association: www.heart.org Contact a health care provider if: You think you are having a reaction to medicines you have taken. You have repeated (recurrent) headaches. You feel dizzy. You have swelling in your ankles. You have trouble with your vision. Get help right away if: You develop a severe headache or confusion. You have unusual weakness or numbness, or you feel faint. You have severe pain in your chest or abdomen. You vomit repeatedly. You have trouble breathing. These symptoms may represent a serious problem that is an emergency. Do not wait to see if the symptoms will go away. Get medical help right away. Call your local emergency services (911 in the [place] Do not drive yourself to the hospital. Summary Hypertension is when the force of blood pumping through your arteries is too strong. If this condition is not controlled, it may put you at risk for serious complications. Your personal target blood pressure may vary depending on   your medical conditions, your age, and other factors. For most people, a normal blood pressure is less than 120/80. Hypertension is managed by lifestyle changes, medicines, or both. Lifestyle changes to help manage hypertension include losing weight, eating a healthy, low-sodium diet, exercising more, stopping smoking, and limiting alcohol. This information is not intended to replace advice given to you by your health care provider. Make sure you discuss any questions  you have with your health care provider. Document Revised: 11/24/2019 Document Reviewed: 09/19/2019 Elsevier Patient Education  2022 Elsevier Inc.  Constipation, Adult Constipation is when a person has trouble pooping (having a bowel movement). When you have this condition, you may poop fewer than 3 times a week. Your poop (stool) may also be dry, hard, or bigger than normal. Follow these instructions at home: Eating and drinking  Eat foods that have a lot of fiber, such as: Fresh fruits and vegetables. Whole grains. Beans. Eat less of foods that are low in fiber and high in fat and sugar, such as: [place] fries. Hamburgers. Cookies. Candy. Soda. Drink enough fluid to keep your pee (urine) pale yellow. General instructions Exercise regularly or as told by your doctor. Try to do 150 minutes of exercise each week. Go to the restroom when you feel like you need to poop. Do not hold it in. Take over-the-counter and prescription medicines only as told by your doctor. These include any fiber supplements. When you poop: Do deep breathing while relaxing your lower belly (abdomen). Relax your pelvic floor. The pelvic floor is a group of muscles that support the rectum, bladder, and intestines (as well as the uterus in women). Watch your condition for any changes. Tell your doctor if you notice any. Keep all follow-up visits as told by your doctor. This is important. Contact a doctor if: You have pain that gets worse. You have a fever. You have not pooped for 4 days. You vomit. You are not hungry. You lose weight. You are bleeding from the opening of the butt (anus). You have thin, pencil-like poop. Get help right away if: You have a fever, and your symptoms suddenly get worse. You leak poop or have blood in your poop. Your belly feels hard or bigger than normal (bloated). You have very bad belly pain. You feel dizzy or you faint. Summary Constipation is when a person poops fewer  than 3 times a week, has trouble pooping, or has poop that is dry, hard, or bigger than normal. Eat foods that have a lot of fiber. Drink enough fluid to keep your pee (urine) pale yellow. Take over-the-counter and prescription medicines only as told by your doctor. These include any fiber supplements. This information is not intended to replace advice given to you by your health care provider. Make sure you discuss any questions you have with your health care provider. Document Revised: 09/06/2019 Document Reviewed: 09/06/2019 Elsevier Patient Education  2022 [employer].

## 2021-08-20 NOTE — Progress Notes (Signed)
[facility] Patient [facility] [address] [place], [place]  [address] Phone:  [phone]   Fax:  [phone]   Established Patient Office Visit  Subjective:  Patient ID: Nina Clark, female    DOB: 08/23/1966  Age: 55 y.o. MRN: [id number]  CC:  Chief Complaint  Patient presents with   Hypertension    Pt is here today for her follow up visit. Pt states that she has been depressed due to her broken shoulder that happened last year in September. Pt would like to speak with a Child psychotherapist about job placement and housing.    HPI Nina Clark presents for follow up. She has been lost to follow up. She  has a past medical history of Carpal tunnel syndrome on both sides, Chronic foot pain, left, History of vertigo, Hyperglycemia (01/3020), Hypertension, and Prediabetes (01/2020).   She  has a past medical history of Carpal tunnel syndrome on both sides, Chronic foot pain, left, History of vertigo, Hyperglycemia (01/3020), Hypertension, and Prediabetes (01/2020).   She continues to have right shoulder pain. She is not able to work since her work related fall. She was provided a settlement. Her MRI was denied the day of the settlement and she can not afford it at this time. She is concern because that she received some medical bills that have made a negative impact on her credit. She thought that she was on a scholarship/grant. She reports being denied Medicaid.   She is following up for hypertension. She has been taking her medication. She continues to be non adherent to a low-salt diet. She does not monitor her BP. Denies headache, dizziness, visual changes, shortness of breath, dyspnea on exertion, chest pain, nausea, vomiting or any edema.    Past Medical History:  Diagnosis Date   Carpal tunnel syndrome on both sides    Chronic foot pain, left    History of vertigo    Hyperglycemia 01/3020   Hypertension    Prediabetes 01/2020    Past Surgical History:  Procedure Laterality Date    EYE SURGERY      Family History  Problem Relation Age of Onset   Hypertension Mother    Hypertension Father    Diabetes Father    Diabetes Sister     Social History   Socioeconomic History   Marital status: Single    Spouse name: Not on file   Number of children: 1   Years of education: College   Highest education level: Not on file  Occupational History    Employer: OTHER    Comment: [employer]  Tobacco Use   Smoking status: Every Day    Packs/day: 0.25    Years: 25.00    Pack years: 6.25    Types: Cigarettes   Smokeless tobacco: Never   Tobacco comments:    6 cigs per day  Vaping Use   Vaping Use: Not on file  Substance and Sexual Activity   Alcohol use: No    Alcohol/week: 0.0 standard drinks   Drug use: Yes    Types: Marijuana    Comment: daily   Sexual activity: Yes    Birth control/protection: None  Other Topics Concern   Not on file  Social History Narrative   Patient lives with family.   Caffeine Use: none   Social Determinants of Health   Financial Resource Strain: Not on file  Food Insecurity: Not on file  Transportation Needs: Not on file  Physical Activity: Not  on file  Stress: Not on file  Social Connections: Not on file  Intimate Partner Violence: Not on file    Outpatient Medications Prior to Visit  Medication Sig Dispense Refill   cloNIDine (CATAPRES) 0.1 MG tablet TAKE 1 TABLET (0.1 MG TOTAL) BY MOUTH 2 (TWO) TIMES DAILY. 60 tablet 11   cyclobenzaprine (FLEXERIL) 10 MG tablet Take 1 tablet (10 mg total) by mouth 3 (three) times daily as needed for muscle spasms. 30 tablet 6   hydrochlorothiazide (HYDRODIURIL) 25 MG tablet TAKE 1 TABLET (25 MG TOTAL) BY MOUTH DAILY. 30 tablet 11   meloxicam (MOBIC) 15 MG tablet TAKE 1 TABLET (15 MG TOTAL) BY MOUTH DAILY. (TAKE IF MOTRIN IS NOT EFFECTIVE) 30 tablet 11   omeprazole (PRILOSEC) 40 MG capsule TAKE 1 CAPSULE (40 MG TOTAL) BY MOUTH DAILY. 30 capsule 11   methocarbamol (ROBAXIN) 500 MG  tablet TAKE 1 TABLET BY MOUTH TWICE DAILY AS NEEDED FOR 20 DAYS (Patient not taking: Reported on 08/20/2021) 40 tablet 1   amoxicillin-clavulanate (AUGMENTIN) 500-125 MG tablet TAKE 1 TABLET EVERY 12 HOURS DAILY. (Patient not taking: Reported on 08/20/2021) 14 tablet 0   No facility-administered medications prior to visit.    Allergies  Allergen Reactions   Ibuprofen Shortness Of Breath    ROS Review of Systems    Objective:    Physical Exam HENT:     Head: Normocephalic and atraumatic.     Nose: Nose normal.     Mouth/Throat:     Mouth: Mucous membranes are moist.  Cardiovascular:     Rate and Rhythm: Normal rate.     Pulses: Normal pulses.     Heart sounds: Normal heart sounds.  Pulmonary:     Effort: Pulmonary effort is normal.     Breath sounds: Normal breath sounds.  Abdominal:     General: Bowel sounds are normal.     Palpations: Abdomen is soft.  Musculoskeletal:     Cervical back: Normal range of motion.     Comments: Rright shoulder fracture   Skin:    General: Skin is warm and dry.     Capillary Refill: Capillary refill takes less than 2 seconds.  Neurological:     General: No focal deficit present.     Mental Status: She is alert and oriented to person, place, and time.  Psychiatric:        Mood and Affect: Mood normal.        Behavior: Behavior normal.        Thought Content: Thought content normal.        Judgment: Judgment normal.    BP (!) 167/115   Pulse (!) 58   Temp 97.7 F (36.5 C)   Ht 5' 4.5" (1.638 m)   Wt 165 lb 12.8 oz (75.2 kg)   SpO2 99%   BMI 28.02 kg/m  Wt Readings from Last 3 Encounters:  08/20/21 165 lb 12.8 oz (75.2 kg)  08/05/20 161 lb (73 kg)  01/22/20 161 lb 6.4 oz (73.2 kg)     There are no preventive care reminders to display for this patient.   There are no preventive care reminders to display for this patient.  Lab Results  Component Value Date   TSH 0.550 01/22/2020   Lab Results  Component Value Date    WBC 5.0 01/22/2020   HGB 12.5 01/22/2020   HCT 35.3 01/22/2020   MCV 87 01/22/2020   PLT 354 01/22/2020   Lab Results  Component  Value Date   NA 135 08/05/2020   K 3.7 08/05/2020   CO2 24 01/22/2020   GLUCOSE 93 08/05/2020   BUN 15 08/05/2020   CREATININE 0.80 08/05/2020   BILITOT 0.6 08/05/2020   ALKPHOS 94 08/05/2020   AST 27 08/05/2020   ALT 41 (H) 01/22/2020   PROT 7.8 08/05/2020   ALBUMIN 4.6 08/05/2020   CALCIUM 10.0 08/05/2020   ANIONGAP 10 01/30/2016   Lab Results  Component Value Date   CHOL 201 (H) 01/22/2020   Lab Results  Component Value Date   HDL 87 01/22/2020   Lab Results  Component Value Date   LDLCALC 106 (H) 01/22/2020   Lab Results  Component Value Date   TRIG 40 01/22/2020   Lab Results  Component Value Date   CHOLHDL 2.3 01/22/2020   Lab Results  Component Value Date   HGBA1C 6.0 (A) 08/20/2021      Assessment & Plan:   Problem List Items Addressed This Visit       Cardiovascular and Mediastinum   Essential hypertension - Primary Uncontrolled  Encouraged on going compliance with current medication regimen Encouraged home monitoring and recording BP <130/80 Eating a heart-healthy diet with less salt Encouraged regular physical activity  Recommend Weight loss     Relevant Orders   Comp. Metabolic Panel (12) (Completed)   Other Visit Diagnoses     Screening for cervical cancer       Screening for cholesterol level       Relevant Orders   Lipid panel (Completed)   Prediabetes     Consider home glucose monitoring Weight loss at least 5% of current body weight is can be achieved with lifestyle modification dietary changes and regular daily exercise Encourage blood pressure control goal <120/80 and maintaining total cholesterol <200 Follow-up every 3 to 6 months for reevaluation Education material provided    Relevant Orders   HgB A1c (Completed)       No orders of the defined types were placed in this  encounter.   Follow-up: No follow-ups on file.    Barbette Merino, NP

## 2021-08-21 ENCOUNTER — Telehealth: Payer: Self-pay | Admitting: Clinical

## 2021-08-21 LAB — COMP. METABOLIC PANEL (12)
AST: 21 IU/L (ref 0–40)
Albumin/Globulin Ratio: 1.5 (ref 1.2–2.2)
Albumin: 4.8 g/dL (ref 3.8–4.9)
Alkaline Phosphatase: 74 IU/L (ref 44–121)
BUN/Creatinine Ratio: 16 (ref 9–23)
BUN: 13 mg/dL (ref 6–24)
Bilirubin Total: 0.4 mg/dL (ref 0.0–1.2)
Calcium: 9.9 mg/dL (ref 8.7–10.2)
Chloride: 99 mmol/L (ref 96–106)
Creatinine, Ser: 0.81 mg/dL (ref 0.57–1.00)
Globulin, Total: 3.2 g/dL (ref 1.5–4.5)
Glucose: 101 mg/dL — ABNORMAL HIGH (ref 70–99)
Potassium: 3.9 mmol/L (ref 3.5–5.2)
Sodium: 140 mmol/L (ref 134–144)
Total Protein: 8 g/dL (ref 6.0–8.5)
eGFR: 85 mL/min/{1.73_m2} (ref >59)

## 2021-08-21 LAB — LIPID PANEL
Chol/HDL Ratio: 3.5 ratio (ref 0.0–4.4)
Cholesterol, Total: 245 mg/dL — ABNORMAL HIGH (ref 100–199)
HDL: 71 mg/dL (ref >39)
LDL Chol Calc (NIH): 156 mg/dL — ABNORMAL HIGH (ref 0–99)
Triglycerides: 103 mg/dL (ref 0–149)
VLDL Cholesterol Cal: 18 mg/dL (ref 5–40)

## 2021-08-21 NOTE — Telephone Encounter (Signed)
[facility] Case Management Referral Note  08/21/2021 Name: Nina Clark MRN: [id number] DOB: [birth year] Nina Clark is a 55 y.o. year old female who sees Barbette Merino, NP for primary care. LCSW was consulted to assess patient's needs and assist the patient with  financial difficulties related to no income and lack of health coverage .  Interpreter: No.   Interpreter Name & Language: none  Assessment: Patient experiencing financial difficulties related to no income and lack of health coverage.   Intervention: CSW called patient to follow up on referral from PCP after last visit. She had questions about bill history with [facility]; referred patient to billing department for these questions. Mailed patient [employer] and [employer]. Reviewed application on the phone today and advised patient on supporting documents to be submitted with the application. Advised patient to follow up with CSW for assistance in scheduling appointment with financial counselor at [facility]) to submit the application. Provided CSW contact information.   Review of patient status, including review of consultants reports, relevant laboratory and other test results, and collaboration with appropriate care team members and the patient's provider was performed as part of comprehensive patient evaluation and provision of services.    Abigail Butts, LCSW [facility] [phone]

## 2021-09-01 ENCOUNTER — Other Ambulatory Visit: Payer: Self-pay

## 2021-09-08 ENCOUNTER — Telehealth: Payer: Self-pay

## 2021-09-08 NOTE — Telephone Encounter (Signed)
Telephoned patient, recording stated, call rejected. Patient referred to [facility] for scheduling.

## 2021-09-15 ENCOUNTER — Ambulatory Visit: Payer: Self-pay | Admitting: Nurse Practitioner

## 2021-09-24 ENCOUNTER — Telehealth: Payer: Self-pay | Admitting: Clinical

## 2021-09-24 NOTE — Telephone Encounter (Signed)
[facility]al [facility] General Follow Up Note  09/24/2021 Name: Nina Clark MRN: [id number] DOB: 1966-06-15 Nina Clark is a 55 y.o. year old female who sees Barbette Merino, NP for primary care. LCSW was initially consulted to assess patient's needs and assist the patient with  financial difficulties related to no income and lack of health coverage.  Interpreter: No.   Interpreter Name & Language: none  Assessment: Patient experiencing financial difficulties related to no income and lack of health coverage.   Ongoing Intervention: Today CSW called patient to follow up on [facility] Card/CAFA applications. Mailed application packet to patient last month. Patient indicated that she had decided not to apply for assistance.   Review of patient status, including review of consultants reports, relevant laboratory and other test results, and collaboration with appropriate care team members and the patient's provider was performed as part of comprehensive patient evaluation and provision of services.    Abigail Butts, LCSW Patient [facility] [phone]

## 2021-10-02 ENCOUNTER — Other Ambulatory Visit: Payer: Self-pay

## 2021-11-03 ENCOUNTER — Other Ambulatory Visit: Payer: Self-pay

## 2021-12-02 ENCOUNTER — Other Ambulatory Visit: Payer: Self-pay

## 2021-12-31 ENCOUNTER — Other Ambulatory Visit: Payer: Self-pay

## 2021-12-31 ENCOUNTER — Other Ambulatory Visit: Payer: Self-pay | Admitting: Nurse Practitioner

## 2021-12-31 DIAGNOSIS — M79672 Pain in left foot: Secondary | ICD-10-CM

## 2021-12-31 DIAGNOSIS — K219 Gastro-esophageal reflux disease without esophagitis: Secondary | ICD-10-CM

## 2021-12-31 DIAGNOSIS — I10 Essential (primary) hypertension: Secondary | ICD-10-CM

## 2021-12-31 MED ORDER — HYDROCHLOROTHIAZIDE 25 MG PO TABS
ORAL_TABLET | Freq: Every day | ORAL | 11 refills | Status: DC
  Filled 2021-12-31: qty 30, fill #0

## 2021-12-31 MED ORDER — OMEPRAZOLE 40 MG PO CPDR
DELAYED_RELEASE_CAPSULE | Freq: Every day | ORAL | 0 refills | Status: DC
  Filled 2022-03-31: qty 30, 30d supply, fill #0

## 2021-12-31 MED ORDER — CLONIDINE HCL 0.1 MG PO TABS
ORAL_TABLET | ORAL | 0 refills | Status: DC
  Filled 2021-12-31: qty 60, fill #0
  Filled 2021-12-31: qty 60, 30d supply, fill #0

## 2021-12-31 MED ORDER — HYDROCHLOROTHIAZIDE 25 MG PO TABS
25.0000 mg | ORAL_TABLET | Freq: Every day | ORAL | 0 refills | Status: DC
  Filled 2021-12-31 (×2): qty 30, 30d supply, fill #0

## 2021-12-31 MED ORDER — MELOXICAM 15 MG PO TABS
ORAL_TABLET | ORAL | 11 refills | Status: DC
  Filled 2021-12-31: qty 30, fill #0

## 2022-01-01 ENCOUNTER — Other Ambulatory Visit: Payer: Self-pay

## 2022-01-28 ENCOUNTER — Other Ambulatory Visit: Payer: Self-pay | Admitting: Nurse Practitioner

## 2022-01-28 DIAGNOSIS — I10 Essential (primary) hypertension: Secondary | ICD-10-CM

## 2022-01-29 ENCOUNTER — Other Ambulatory Visit: Payer: Self-pay

## 2022-01-29 ENCOUNTER — Telehealth: Payer: Self-pay

## 2022-01-29 MED ORDER — CLONIDINE HCL 0.1 MG PO TABS
ORAL_TABLET | ORAL | 0 refills | Status: DC
  Filled 2022-01-29: qty 60, 30d supply, fill #0

## 2022-01-29 MED ORDER — HYDROCHLOROTHIAZIDE 25 MG PO TABS
25.0000 mg | ORAL_TABLET | Freq: Every day | ORAL | 0 refills | Status: DC
  Filled 2022-01-29: qty 30, 30d supply, fill #0

## 2022-01-29 NOTE — Telephone Encounter (Signed)
Patient is due for follow up.

## 2022-01-29 NOTE — Telephone Encounter (Signed)
Clonidine ?Hydrochlorothiazide ? ? ?[facility] ?

## 2022-02-02 ENCOUNTER — Ambulatory Visit: Payer: No Typology Code available for payment source | Admitting: Nurse Practitioner

## 2022-02-11 ENCOUNTER — Ambulatory Visit (INDEPENDENT_AMBULATORY_CARE_PROVIDER_SITE_OTHER): Payer: Self-pay | Admitting: Nurse Practitioner

## 2022-02-11 ENCOUNTER — Other Ambulatory Visit: Payer: Self-pay

## 2022-02-11 ENCOUNTER — Encounter: Payer: Self-pay | Admitting: Nurse Practitioner

## 2022-02-11 VITALS — BP 139/82 | HR 63 | Temp 98.3°F | Ht 64.5 in | Wt 167.5 lb

## 2022-02-11 DIAGNOSIS — K219 Gastro-esophageal reflux disease without esophagitis: Secondary | ICD-10-CM

## 2022-02-11 DIAGNOSIS — Z131 Encounter for screening for diabetes mellitus: Secondary | ICD-10-CM

## 2022-02-11 DIAGNOSIS — M79672 Pain in left foot: Secondary | ICD-10-CM

## 2022-02-11 DIAGNOSIS — I10 Essential (primary) hypertension: Secondary | ICD-10-CM

## 2022-02-11 DIAGNOSIS — E7800 Pure hypercholesterolemia, unspecified: Secondary | ICD-10-CM

## 2022-02-11 DIAGNOSIS — M79605 Pain in left leg: Secondary | ICD-10-CM

## 2022-02-11 LAB — POCT GLYCOSYLATED HEMOGLOBIN (HGB A1C)
HbA1c POC (<> result, manual entry): 6.1 % (ref 4.0–5.6)
HbA1c, POC (controlled diabetic range): 6.1 % (ref 0.0–7.0)
HbA1c, POC (prediabetic range): 6.1 % (ref 5.7–6.4)
Hemoglobin A1C: 6.1 % — AB (ref 4.0–5.6)

## 2022-02-11 MED ORDER — CLONIDINE HCL 0.1 MG PO TABS
ORAL_TABLET | ORAL | 0 refills | Status: DC
  Filled 2022-02-11 – 2022-03-01 (×2): qty 60, 30d supply, fill #0

## 2022-02-11 MED ORDER — OMEPRAZOLE 40 MG PO CPDR
DELAYED_RELEASE_CAPSULE | Freq: Every day | ORAL | 11 refills | Status: DC
  Filled 2022-02-11 – 2022-03-01 (×2): qty 30, 30d supply, fill #0
  Filled 2022-04-28: qty 30, 30d supply, fill #1
  Filled 2022-05-27: qty 30, 30d supply, fill #2
  Filled 2022-06-26: qty 30, 30d supply, fill #3
  Filled 2022-07-21: qty 30, 30d supply, fill #4

## 2022-02-11 MED ORDER — MELOXICAM 15 MG PO TABS
15.0000 mg | ORAL_TABLET | Freq: Every day | ORAL | 0 refills | Status: DC
Start: 2022-02-11 — End: 2022-03-31
  Filled 2022-02-11 – 2022-03-31 (×3): qty 30, 30d supply, fill #0

## 2022-02-11 MED ORDER — HYDROCHLOROTHIAZIDE 25 MG PO TABS
25.0000 mg | ORAL_TABLET | Freq: Every day | ORAL | 0 refills | Status: DC
  Filled 2022-02-11 – 2022-03-01 (×2): qty 30, 30d supply, fill #0

## 2022-02-11 NOTE — Patient Instructions (Addendum)
1. Hypertension, unspecified type ? ?- Basic Metabolic Panel ?- CBC ?- Lipid Panel ?- HgB A1c ? ?2. Elevated cholesterol ? ?- Lipid Panel ? ?3. Diabetes mellitus screening ? ?- Basic Metabolic Panel ?- CBC ?- Lipid Panel ?- HgB A1c ? ?4. Essential hypertension ? ?- cloNIDine (CATAPRES) 0.1 MG tablet; TAKE 1 TABLET (0.1 MG TOTAL) BY MOUTH 2 (TWO) TIMES DAILY.  Dispense: 60 tablet; Refill: 0 ?- hydrochlorothiazide (HYDRODIURIL) 25 MG tablet; Take 1 tablet (25 mg total) by mouth daily.  Dispense: 30 tablet; Refill: 0 ? ?5. Gastroesophageal reflux disease without esophagitis ? ?- omeprazole (PRILOSEC) 40 MG capsule; TAKE 1 CAPSULE (40 MG TOTAL) BY MOUTH DAILY.  Dispense: 30 capsule; Refill: 11 ? ?6. Left foot pain ? ? ?7. Pain of left lower extremity ? ?- meloxicam (MOBIC) 15 MG tablet; Take 1 tablet (15 mg total) by mouth daily.  Dispense: 30 tablet; Refill: 0 ? ? ?Follow up: ? ?Follow up in 6 months ? ?

## 2022-02-11 NOTE — Progress Notes (Signed)
@Patient  ID: , female    DOB: Nov 07, 1965, 56 y.o.   MRN: [id number] ? ?Chief Complaint  ?Patient presents with  ? Follow-up  ?  Pt is here for follow up and refill on all medications  ? ? ?Referring provider: ?[id number], NP ? ? ?HPI ? ? ?Patient presents today for routine follow-up for hypertension and cholesterol.  She states that overall everything has been going well.  She states that she is compliant with medications.  Pressure is within normal range in office today.  Patient does need refills on her medications today. Denies f/c/s, n/v/d, hemoptysis, PND, chest pain or edema. ? ? ? ? ? ? ?Allergies  ?Allergen Reactions  ? Ibuprofen Shortness Of Breath  ? ? ?Immunization History  ?Administered Date(s) Administered  ? PFIZER(Purple Top)SARS-COV-2 Vaccination 01/22/2020, 02/12/2020  ? Tdap 05/18/2016  ? ? ?Past Medical History:  ?Diagnosis Date  ? Carpal tunnel syndrome on both sides   ? Chronic foot pain, left   ? History of vertigo   ? Hyperglycemia 01/3020  ? Hypertension   ? Prediabetes 01/2020  ? ? ?Tobacco History: ?Social History  ? ?Tobacco Use  ?Smoking Status Every Day  ? Packs/day: 0.25  ? Years: 25.00  ? Pack years: 6.25  ? Types: Cigarettes  ?Smokeless Tobacco Never  ?Tobacco Comments  ? 6 cigs per day  ? ?Ready to quit: Not Answered ?Counseling given: Not Answered ?Tobacco comments: 6 cigs per day ? ? ?Outpatient Encounter Medications as of 02/11/2022  ?Medication Sig  ? meloxicam (MOBIC) 15 MG tablet TAKE 1 TABLET (15 MG TOTAL) BY MOUTH DAILY. (TAKE IF MOTRIN IS NOT EFFECTIVE)  ? meloxicam (MOBIC) 15 MG tablet Take 1 tablet (15 mg total) by mouth daily.  ? [DISCONTINUED] cloNIDine (CATAPRES) 0.1 MG tablet TAKE 1 TABLET (0.1 MG TOTAL) BY MOUTH 2 (TWO) TIMES DAILY.  ? [DISCONTINUED] hydrochlorothiazide (HYDRODIURIL) 25 MG tablet Take 1 tablet (25 mg total) by mouth daily.  ? [DISCONTINUED] omeprazole (PRILOSEC) 40 MG capsule TAKE 1 CAPSULE (40 MG TOTAL) BY MOUTH DAILY.  ? cloNIDine  (CATAPRES) 0.1 MG tablet TAKE 1 TABLET (0.1 MG TOTAL) BY MOUTH 2 (TWO) TIMES DAILY.  ? cyclobenzaprine (FLEXERIL) 10 MG tablet Take 1 tablet (10 mg total) by mouth 3 (three) times daily as needed for muscle spasms. (Patient not taking: Reported on 02/11/2022)  ? hydrochlorothiazide (HYDRODIURIL) 25 MG tablet Take 1 tablet (25 mg total) by mouth daily.  ? omeprazole (PRILOSEC) 40 MG capsule TAKE 1 CAPSULE (40 MG TOTAL) BY MOUTH DAILY.  ? omeprazole (PRILOSEC) 40 MG capsule TAKE 1 CAPSULE (40 MG TOTAL) BY MOUTH DAILY.  ? ?No facility-administered encounter medications on file as of 02/11/2022.  ? ? ? ?Review of Systems ? ?Review of Systems  ?Constitutional: Negative.   ?HENT: Negative.    ?Cardiovascular: Negative.   ?Gastrointestinal: Negative.   ?Allergic/Immunologic: Negative.   ?Neurological: Negative.   ?Psychiatric/Behavioral: Negative.     ? ? ? ?Physical Exam ? ?BP 139/82 (BP Location: Left Arm, Patient Position: Sitting, Cuff Size: Normal)   Pulse 63   Temp 98.3 ?F (36.8 ?C)   Ht 5' 4.5" (1.638 m)   Wt 167 lb 8 oz (76 kg)   SpO2 100%   BMI 28.31 kg/m?  ? ?Wt Readings from Last 5 Encounters:  ?02/11/22 167 lb 8 oz (76 kg)  ?08/20/21 165 lb 12.8 oz (75.2 kg)  ?08/05/20 161 lb (73 kg)  ?01/22/20 161 lb 6.4 oz (73.2 kg)  ?  07/25/19 174 lb 6.4 oz (79.1 kg)  ? ? ? ?Physical Exam ?Vitals and nursing note reviewed.  ?Constitutional:   ?   General: She is not in acute distress. ?   Appearance: She is well-developed.  ?Cardiovascular:  ?   Rate and Rhythm: Normal rate and regular rhythm.  ?Pulmonary:  ?   Effort: Pulmonary effort is normal.  ?   Breath sounds: Normal breath sounds.  ?Neurological:  ?   Mental Status: She is alert and oriented to person, place, and time.  ? ? ? ?Lab Results: ? ?CBC ?   ?Component Value Date/Time  ? WBC 5.0 01/22/2020 0945  ? WBC 6.2 08/09/2017 1216  ? RBC 4.05 01/22/2020 0945  ? RBC 3.87 08/09/2017 1216  ? HGB 12.5 01/22/2020 0945  ? HCT 35.3 01/22/2020 0945  ? PLT 354 01/22/2020  0945  ? MCV 87 01/22/2020 0945  ? MCH 30.9 01/22/2020 0945  ? MCH 30.0 08/09/2017 1216  ? MCHC 35.4 01/22/2020 0945  ? MCHC 34.5 08/09/2017 1216  ? RDW 13.0 01/22/2020 0945  ? LYMPHSABS 1.9 01/22/2020 0945  ? MONOABS 0.3 01/30/2016 0945  ? EOSABS 0.1 01/22/2020 0945  ? BASOSABS 0.1 01/22/2020 0945  ? ? ?BMET ?   ?Component Value Date/Time  ? NA 140 08/20/2021 1554  ? K 3.9 08/20/2021 1554  ? CL 99 08/20/2021 1554  ? CO2 24 01/22/2020 0945  ? GLUCOSE 101 (H) 08/20/2021 1554  ? GLUCOSE 93 08/09/2017 1224  ? BUN 13 08/20/2021 1554  ? CREATININE 0.81 08/20/2021 1554  ? CREATININE 0.73 08/09/2017 1224  ? CALCIUM 9.9 08/20/2021 1554  ? GFRNONAA 83 08/05/2020 1127  ? GFRNONAA 94 08/09/2017 1224  ? GFRAA 95 08/05/2020 1127  ? GFRAA 109 08/09/2017 1224  ? ? ? ?Assessment & Plan:  ? ?Essential hypertension ?- Basic Metabolic Panel ?- CBC ?- Lipid Panel ?- HgB A1c ? ?2. Elevated cholesterol ? ?- Lipid Panel ? ?3. Diabetes mellitus screening ? ?- Basic Metabolic Panel ?- CBC ?- Lipid Panel ?- HgB A1c ? ?4. Essential hypertension ? ?- cloNIDine (CATAPRES) 0.1 MG tablet; TAKE 1 TABLET (0.1 MG TOTAL) BY MOUTH 2 (TWO) TIMES DAILY.  Dispense: 60 tablet; Refill: 0 ?- hydrochlorothiazide (HYDRODIURIL) 25 MG tablet; Take 1 tablet (25 mg total) by mouth daily.  Dispense: 30 tablet; Refill: 0 ? ?5. Gastroesophageal reflux disease without esophagitis ? ?- omeprazole (PRILOSEC) 40 MG capsule; TAKE 1 CAPSULE (40 MG TOTAL) BY MOUTH DAILY.  Dispense: 30 capsule; Refill: 11 ? ?6. Left foot pain ? ? ?7. Pain of left lower extremity ? ?- meloxicam (MOBIC) 15 MG tablet; Take 1 tablet (15 mg total) by mouth daily.  Dispense: 30 tablet; Refill: 0 ? ?Follow up: ? ?Follow up in 6 months ? ? ? ? ?Ivonne Andrew, NP ?02/11/2022 ? ?

## 2022-02-11 NOTE — Assessment & Plan Note (Signed)
-   Basic Metabolic Panel ?- CBC ?- Lipid Panel ?- HgB A1c ? ?2. Elevated cholesterol ? ?- Lipid Panel ? ?3. Diabetes mellitus screening ? ?- Basic Metabolic Panel ?- CBC ?- Lipid Panel ?- HgB A1c ? ?4. Essential hypertension ? ?- cloNIDine (CATAPRES) 0.1 MG tablet; TAKE 1 TABLET (0.1 MG TOTAL) BY MOUTH 2 (TWO) TIMES DAILY.  Dispense: 60 tablet; Refill: 0 ?- hydrochlorothiazide (HYDRODIURIL) 25 MG tablet; Take 1 tablet (25 mg total) by mouth daily.  Dispense: 30 tablet; Refill: 0 ? ?5. Gastroesophageal reflux disease without esophagitis ? ?- omeprazole (PRILOSEC) 40 MG capsule; TAKE 1 CAPSULE (40 MG TOTAL) BY MOUTH DAILY.  Dispense: 30 capsule; Refill: 11 ? ?6. Left foot pain ? ? ?7. Pain of left lower extremity ? ?- meloxicam (MOBIC) 15 MG tablet; Take 1 tablet (15 mg total) by mouth daily.  Dispense: 30 tablet; Refill: 0 ? ?Follow up: ? ?Follow up in 6 months ? ?

## 2022-02-12 LAB — CBC
Hematocrit: 35.4 % (ref 34.0–46.6)
Hemoglobin: 12 g/dL (ref 11.1–15.9)
MCH: 30 pg (ref 26.6–33.0)
MCHC: 33.9 g/dL (ref 31.5–35.7)
MCV: 89 fL (ref 79–97)
Platelets: 320 10*3/uL (ref 150–450)
RBC: 4 x10E6/uL (ref 3.77–5.28)
RDW: 13.4 % (ref 11.7–15.4)
WBC: 4.2 10*3/uL (ref 3.4–10.8)

## 2022-02-12 LAB — BASIC METABOLIC PANEL
BUN/Creatinine Ratio: 16 (ref 9–23)
BUN: 14 mg/dL (ref 6–24)
CO2: 27 mmol/L (ref 20–29)
Calcium: 9.6 mg/dL (ref 8.7–10.2)
Chloride: 101 mmol/L (ref 96–106)
Creatinine, Ser: 0.86 mg/dL (ref 0.57–1.00)
Glucose: 126 mg/dL — ABNORMAL HIGH (ref 70–99)
Potassium: 3.8 mmol/L (ref 3.5–5.2)
Sodium: 142 mmol/L (ref 134–144)
eGFR: 78 mL/min/{1.73_m2} (ref >59)

## 2022-02-12 LAB — LIPID PANEL
Chol/HDL Ratio: 3.1 ratio (ref 0.0–4.4)
Cholesterol, Total: 216 mg/dL — ABNORMAL HIGH (ref 100–199)
HDL: 69 mg/dL (ref >39)
LDL Chol Calc (NIH): 133 mg/dL — ABNORMAL HIGH (ref 0–99)
Triglycerides: 77 mg/dL (ref 0–149)
VLDL Cholesterol Cal: 14 mg/dL (ref 5–40)

## 2022-03-02 ENCOUNTER — Other Ambulatory Visit: Payer: Self-pay

## 2022-03-31 ENCOUNTER — Other Ambulatory Visit: Payer: Self-pay

## 2022-03-31 ENCOUNTER — Other Ambulatory Visit: Payer: Self-pay | Admitting: Nurse Practitioner

## 2022-03-31 DIAGNOSIS — I10 Essential (primary) hypertension: Secondary | ICD-10-CM

## 2022-03-31 DIAGNOSIS — M79605 Pain in left leg: Secondary | ICD-10-CM

## 2022-03-31 MED ORDER — HYDROCHLOROTHIAZIDE 25 MG PO TABS
25.0000 mg | ORAL_TABLET | Freq: Every day | ORAL | 3 refills | Status: DC
  Filled 2022-03-31: qty 30, 30d supply, fill #0
  Filled 2022-04-28: qty 30, 30d supply, fill #1
  Filled 2022-05-27: qty 30, 30d supply, fill #2
  Filled 2022-06-26: qty 30, 30d supply, fill #3

## 2022-03-31 MED ORDER — CLONIDINE HCL 0.1 MG PO TABS
ORAL_TABLET | ORAL | 3 refills | Status: DC
  Filled 2022-03-31: qty 60, 30d supply, fill #0
  Filled 2022-04-28: qty 60, 30d supply, fill #1
  Filled 2022-05-27: qty 60, 30d supply, fill #2
  Filled 2022-06-26: qty 60, 30d supply, fill #3

## 2022-04-01 ENCOUNTER — Other Ambulatory Visit: Payer: Self-pay

## 2022-04-02 ENCOUNTER — Other Ambulatory Visit: Payer: Self-pay

## 2022-04-02 MED ORDER — MELOXICAM 15 MG PO TABS
15.0000 mg | ORAL_TABLET | Freq: Every day | ORAL | 3 refills | Status: DC
  Filled 2022-04-02 – 2022-05-27 (×2): qty 30, 30d supply, fill #0
  Filled 2022-06-26: qty 30, 30d supply, fill #1
  Filled 2022-07-21: qty 30, 30d supply, fill #2

## 2022-04-28 ENCOUNTER — Other Ambulatory Visit: Payer: Self-pay

## 2022-05-27 ENCOUNTER — Other Ambulatory Visit: Payer: Self-pay

## 2022-06-26 ENCOUNTER — Other Ambulatory Visit: Payer: Self-pay

## 2022-07-21 ENCOUNTER — Other Ambulatory Visit: Payer: Self-pay | Admitting: Nurse Practitioner

## 2022-07-21 ENCOUNTER — Other Ambulatory Visit: Payer: Self-pay

## 2022-07-21 DIAGNOSIS — I10 Essential (primary) hypertension: Secondary | ICD-10-CM

## 2022-07-23 ENCOUNTER — Other Ambulatory Visit: Payer: Self-pay

## 2022-07-23 ENCOUNTER — Other Ambulatory Visit: Payer: Self-pay | Admitting: Nurse Practitioner

## 2022-07-23 DIAGNOSIS — I10 Essential (primary) hypertension: Secondary | ICD-10-CM

## 2022-07-24 ENCOUNTER — Telehealth: Payer: Self-pay

## 2022-07-24 ENCOUNTER — Other Ambulatory Visit: Payer: Self-pay

## 2022-07-24 DIAGNOSIS — I10 Essential (primary) hypertension: Secondary | ICD-10-CM

## 2022-07-24 MED ORDER — CLONIDINE HCL 0.1 MG PO TABS
ORAL_TABLET | ORAL | 3 refills | Status: DC
  Filled 2022-07-24: qty 60, 30d supply, fill #0

## 2022-07-24 MED ORDER — HYDROCHLOROTHIAZIDE 25 MG PO TABS
25.0000 mg | ORAL_TABLET | Freq: Every day | ORAL | 3 refills | Status: DC
  Filled 2022-07-24: qty 30, 30d supply, fill #0

## 2022-07-24 NOTE — Telephone Encounter (Signed)
No additional notes needed  

## 2022-08-13 ENCOUNTER — Encounter: Payer: Self-pay | Admitting: Nurse Practitioner

## 2022-08-13 ENCOUNTER — Ambulatory Visit (INDEPENDENT_AMBULATORY_CARE_PROVIDER_SITE_OTHER): Payer: Self-pay | Admitting: Nurse Practitioner

## 2022-08-13 ENCOUNTER — Other Ambulatory Visit (HOSPITAL_COMMUNITY): Payer: Self-pay

## 2022-08-13 ENCOUNTER — Other Ambulatory Visit: Payer: Self-pay

## 2022-08-13 VITALS — BP 134/76 | HR 65 | Temp 97.6°F | Ht 64.2 in | Wt 164.8 lb

## 2022-08-13 DIAGNOSIS — M25562 Pain in left knee: Secondary | ICD-10-CM

## 2022-08-13 DIAGNOSIS — K219 Gastro-esophageal reflux disease without esophagitis: Secondary | ICD-10-CM

## 2022-08-13 DIAGNOSIS — I10 Essential (primary) hypertension: Secondary | ICD-10-CM

## 2022-08-13 DIAGNOSIS — G8929 Other chronic pain: Secondary | ICD-10-CM | POA: Insufficient documentation

## 2022-08-13 DIAGNOSIS — M79605 Pain in left leg: Secondary | ICD-10-CM

## 2022-08-13 MED ORDER — OMEPRAZOLE 40 MG PO CPDR
40.0000 mg | DELAYED_RELEASE_CAPSULE | Freq: Every day | ORAL | 11 refills | Status: DC
  Filled 2022-08-13: qty 30, fill #0
  Filled 2022-08-20: qty 30, 30d supply, fill #0
  Filled 2022-09-21: qty 30, 30d supply, fill #1
  Filled 2022-10-16: qty 30, 30d supply, fill #2
  Filled 2022-11-16: qty 30, 30d supply, fill #3
  Filled 2022-12-23 (×2): qty 30, 30d supply, fill #4
  Filled 2023-01-25: qty 30, 30d supply, fill #5
  Filled 2023-02-22: qty 30, 30d supply, fill #6
  Filled 2023-03-24: qty 30, 30d supply, fill #7
  Filled 2023-04-20: qty 30, 30d supply, fill #8
  Filled 2023-05-19: qty 30, 30d supply, fill #9
  Filled 2023-05-20: qty 30, 30d supply, fill #0
  Filled 2023-06-23: qty 30, 30d supply, fill #1
  Filled 2023-07-26: qty 30, 30d supply, fill #2

## 2022-08-13 MED ORDER — CLONIDINE HCL 0.1 MG PO TABS
0.1000 mg | ORAL_TABLET | Freq: Two times a day (BID) | ORAL | 11 refills | Status: DC
  Filled 2022-08-13 – 2022-08-20 (×2): qty 60, 30d supply, fill #0
  Filled 2022-09-21: qty 60, 30d supply, fill #1
  Filled 2022-10-16: qty 60, 30d supply, fill #2
  Filled 2022-11-16: qty 60, 30d supply, fill #3
  Filled 2022-12-23 (×2): qty 60, 30d supply, fill #4
  Filled 2023-01-25: qty 60, 30d supply, fill #5
  Filled 2023-02-22: qty 60, 30d supply, fill #6
  Filled 2023-03-24: qty 60, 30d supply, fill #7
  Filled 2023-04-20: qty 60, 30d supply, fill #8
  Filled 2023-05-19: qty 60, 30d supply, fill #9
  Filled 2023-05-20: qty 60, 30d supply, fill #0
  Filled 2023-06-23: qty 60, 30d supply, fill #1

## 2022-08-13 MED ORDER — MELOXICAM 15 MG PO TABS
15.0000 mg | ORAL_TABLET | Freq: Every day | ORAL | 11 refills | Status: DC
  Filled 2022-08-13 – 2022-08-20 (×2): qty 30, 30d supply, fill #0
  Filled 2022-09-21: qty 30, 30d supply, fill #1
  Filled 2022-10-16: qty 30, 30d supply, fill #2
  Filled 2022-11-16: qty 30, 30d supply, fill #3
  Filled 2022-12-23 (×2): qty 30, 30d supply, fill #4
  Filled 2023-01-25: qty 30, 30d supply, fill #5
  Filled 2023-02-22: qty 30, 30d supply, fill #6
  Filled 2023-03-24: qty 30, 30d supply, fill #7
  Filled 2023-04-20: qty 30, 30d supply, fill #8
  Filled 2023-05-19: qty 30, 30d supply, fill #9
  Filled 2023-05-20: qty 30, 30d supply, fill #0
  Filled 2023-06-23: qty 30, 30d supply, fill #1
  Filled 2023-07-26: qty 30, 30d supply, fill #2

## 2022-08-13 MED ORDER — HYDROCHLOROTHIAZIDE 25 MG PO TABS
25.0000 mg | ORAL_TABLET | Freq: Every day | ORAL | 11 refills | Status: DC
  Filled 2022-08-13 – 2022-08-20 (×2): qty 30, 30d supply, fill #0
  Filled 2022-09-21: qty 30, 30d supply, fill #1
  Filled 2022-10-16: qty 30, 30d supply, fill #2
  Filled 2022-11-16: qty 30, 30d supply, fill #3
  Filled 2022-12-23 (×2): qty 30, 30d supply, fill #4
  Filled 2023-01-25: qty 30, 30d supply, fill #5
  Filled 2023-02-22: qty 30, 30d supply, fill #6
  Filled 2023-03-24: qty 30, 30d supply, fill #7
  Filled 2023-04-20: qty 30, 30d supply, fill #8
  Filled 2023-05-19: qty 30, 30d supply, fill #9
  Filled 2023-05-20: qty 30, 30d supply, fill #0
  Filled 2023-06-23: qty 30, 30d supply, fill #1

## 2022-08-13 MED ORDER — PREDNISONE 10 MG PO TABS
ORAL_TABLET | ORAL | 0 refills | Status: AC
  Filled 2022-08-13: qty 20, 8d supply, fill #0

## 2022-08-13 NOTE — Assessment & Plan Note (Signed)
-   predniSONE (DELTASONE) 10 MG tablet; Take 4 tabs for 2 days, then 3 tabs for 2 days, then 2 tabs for 2 days, then 1 tab for 2 days, then stop  Dispense: 20 tablet; Refill: 0  2. Essential hypertension  Continue current medications    Follow up:  Follow up in 6 months or sooner if needed

## 2022-08-13 NOTE — Progress Notes (Signed)
@Patient  ID: , female    DOB: 08/21/1966, 56 y.o.   MRN: [id number]  Chief Complaint  Patient presents with   Hypertension    Referring provider: [id number], NP   HPI  Nina Clark presents for follow up. She has been lost to follow up. She  has a past medical history of Carpal tunnel syndrome on both sides, Chronic foot pain, left, History of vertigo, Hyperglycemia (01/3020), Hypertension, and Prediabetes (01/2020).   She is following up for hypertension. She has been taking her medication. She continues to be non adherent to a low-salt diet. She does not monitor her BP. Denies headache, dizziness, visual changes, shortness of breath, dyspnea on exertion, chest pain, nausea, vomiting or any edema.   Not working right now. She states that she is having a hard time paying bills. She hurt her shoulder at work. 2 years ago. Did have treatment, but states that she does still have pain. She is uninsured and is working on 02/2020 up.  When she has insurance she may need a referral back to Ortho or physical therapy.  Patient also complains of left knee pain.  She states that this has been an ongoing issue for the past few weeks.  She states that she did feel her she relates now.  She has been using alternative therapies such as turmeric and states that this has helped.  Patient declines x-ray today due to not having insurance.  We will trial a round of prednisone.    Allergies  Allergen Reactions   Ibuprofen Shortness Of Breath    Immunization History  Administered Date(s) Administered   PFIZER(Purple Top)SARS-COV-2 Vaccination 01/22/2020, 02/12/2020   Tdap 05/18/2016    Past Medical History:  Diagnosis Date   Carpal tunnel syndrome on both sides    Chronic foot pain, left    History of vertigo    Hyperglycemia 01/3020   Hypertension    Prediabetes 01/2020    Tobacco History: Social History   Tobacco Use  Smoking Status Every Day   Packs/day:  0.25   Years: 25.00   Total pack years: 6.25   Types: Cigarettes  Smokeless Tobacco Never  Tobacco Comments   6 cigs per day   Ready to quit: Not Answered Counseling given: Not Answered Tobacco comments: 6 cigs per day   Outpatient Encounter Medications as of 08/13/2022  Medication Sig   predniSONE (DELTASONE) 10 MG tablet Take 4 tabs for 2 days, then 3 tabs for 2 days, then 2 tabs for 2 days, then 1 tab for 2 days, then stop   [DISCONTINUED] hydrochlorothiazide (HYDRODIURIL) 25 MG tablet Take 1 tablet (25 mg total) by mouth daily.   [DISCONTINUED] meloxicam (MOBIC) 15 MG tablet Take 1 tablet (15 mg total) by mouth daily.   [DISCONTINUED] omeprazole (PRILOSEC) 40 MG capsule TAKE 1 CAPSULE (40 MG TOTAL) BY MOUTH DAILY.   cloNIDine (CATAPRES) 0.1 MG tablet TAKE 1 TABLET (0.1 MG TOTAL) BY MOUTH 2 (TWO) TIMES DAILY.   cyclobenzaprine (FLEXERIL) 10 MG tablet Take 1 tablet (10 mg total) by mouth 3 (three) times daily as needed for muscle spasms. (Patient not taking: Reported on 02/11/2022)   hydrochlorothiazide (HYDRODIURIL) 25 MG tablet Take 1 tablet (25 mg total) by mouth daily.   meloxicam (MOBIC) 15 MG tablet Take 1 tablet (15 mg total) by mouth daily.   omeprazole (PRILOSEC) 40 MG capsule TAKE 1 CAPSULE (40 MG TOTAL) BY MOUTH DAILY.   [DISCONTINUED] cloNIDine (CATAPRES) 0.1  MG tablet TAKE 1 TABLET (0.1 MG TOTAL) BY MOUTH 2 (TWO) TIMES DAILY.   [DISCONTINUED] omeprazole (PRILOSEC) 40 MG capsule TAKE 1 CAPSULE (40 MG TOTAL) BY MOUTH DAILY.   No facility-administered encounter medications on file as of 08/13/2022.     Review of Systems  Review of Systems  Constitutional: Negative.   HENT: Negative.    Cardiovascular: Negative.   Gastrointestinal: Negative.   Musculoskeletal:        Left knee pain  Allergic/Immunologic: Negative.   Neurological: Negative.   Psychiatric/Behavioral: Negative.         Physical Exam  BP 134/76   Pulse 65   Temp 97.6 F (36.4 C) (Temporal)    Ht 5' 4.2" (1.631 m)   Wt 164 lb 12.8 oz (74.8 kg)   SpO2 100%   BMI 28.11 kg/m   Wt Readings from Last 5 Encounters:  08/13/22 164 lb 12.8 oz (74.8 kg)  02/11/22 167 lb 8 oz (76 kg)  08/20/21 165 lb 12.8 oz (75.2 kg)  08/05/20 161 lb (73 kg)  01/22/20 161 lb 6.4 oz (73.2 kg)     Physical Exam Vitals and nursing note reviewed.  Constitutional:      General: She is not in acute distress.    Appearance: She is well-developed.  Cardiovascular:     Rate and Rhythm: Normal rate and regular rhythm.  Pulmonary:     Effort: Pulmonary effort is normal.     Breath sounds: Normal breath sounds.  Musculoskeletal:     Left knee: Swelling present. No erythema. Normal range of motion.  Neurological:     Mental Status: She is alert and oriented to person, place, and time.      Assessment & Plan:   Chronic pain of left knee - predniSONE (DELTASONE) 10 MG tablet; Take 4 tabs for 2 days, then 3 tabs for 2 days, then 2 tabs for 2 days, then 1 tab for 2 days, then stop  Dispense: 20 tablet; Refill: 0  2. Essential hypertension  Continue current medications    Follow up:  Follow up in 6 months or sooner if needed     Fenton Foy, NP 08/13/2022

## 2022-08-13 NOTE — Patient Instructions (Signed)
1. Chronic pain of left knee  - predniSONE (DELTASONE) 10 MG tablet; Take 4 tabs for 2 days, then 3 tabs for 2 days, then 2 tabs for 2 days, then 1 tab for 2 days, then stop  Dispense: 20 tablet; Refill: 0  2. Essential hypertension  Continue current medications    Follow up:  Follow up in 6 months or sooner if needed    Acute Knee Pain, Adult  Many things can cause knee pain. Sometimes, knee pain is sudden (acute) and may be caused by damage, swelling, or irritation of the muscles and tissues that support your knee. The pain often goes away on its own with time and rest. If the pain does not go away, tests may be done to find out what is causing the pain. Follow these instructions at home: If you have a knee sleeve or brace:  Wear the knee sleeve or brace as told by your doctor. Take it off only as told by your doctor. Loosen it if your toes: Tingle. Become numb. Turn cold and blue. Keep it clean. If the knee sleeve or brace is not waterproof: Do not let it get wet. Cover it with a watertight covering when you take a bath or shower. Activity Rest your knee. Do not do things that cause pain or make pain worse. Avoid activities where both feet leave the ground at the same time (high-impact activities). Examples are running, jumping rope, and doing jumping jacks. Work with a physical therapist to make a safe exercise program, as told by your doctor. Managing pain, stiffness, and swelling  If told, put ice on the knee. To do this: If you have a removable knee sleeve or brace, take it off as told by your doctor. Put ice in a plastic bag. Place a towel between your skin and the bag. Leave the ice on for 20 minutes, 2-3 times a day. Take off the ice if your skin turns bright red. This is very important. If you cannot feel pain, heat, or cold, you have a greater risk of damage to the area. If told, use an elastic bandage to put pressure (compression) on your injured knee. Raise  your knee above the level of your heart while you are sitting or lying down. Sleep with a pillow under your knee. General instructions Take over-the-counter and prescription medicines only as told by your doctor. Do not smoke or use any products that contain nicotine or tobacco. If you need help quitting, ask your doctor. If you are overweight, work with your doctor and a food expert (dietitian) to set goals to lose weight. Being overweight can make your knee hurt more. Watch for any changes in your symptoms. Keep all follow-up visits. Contact a doctor if: The knee pain does not stop. The knee pain changes or gets worse. You have a fever along with knee pain. Your knee is red or feels warm when you touch it. Your knee gives out or locks up. Get help right away if: Your knee swells, and the swelling gets worse. You cannot move your knee. You have very bad knee pain that does not get better with pain medicine. Summary Many things can cause knee pain. The pain often goes away on its own with time and rest. Your doctor may do tests to find out the cause of the pain. Watch for any changes in your symptoms. Relieve your pain with rest, medicines, light activity, and use of ice. Get help right away if you  cannot move your knee or your knee pain is very bad. This information is not intended to replace advice given to you by your health care provider. Make sure you discuss any questions you have with your health care provider. Document Revised: 04/03/2020 Document Reviewed: 04/03/2020 Elsevier Patient Education  2023 [employer].

## 2022-08-13 NOTE — Progress Notes (Signed)
Pt has fluid on her left knee and it is causing discomfort.

## 2022-08-20 ENCOUNTER — Other Ambulatory Visit: Payer: Self-pay

## 2022-09-07 ENCOUNTER — Other Ambulatory Visit: Payer: Self-pay

## 2022-09-07 ENCOUNTER — Ambulatory Visit (INDEPENDENT_AMBULATORY_CARE_PROVIDER_SITE_OTHER): Payer: Commercial Managed Care - HMO | Admitting: Family

## 2022-09-07 ENCOUNTER — Encounter: Payer: Self-pay | Admitting: Family

## 2022-09-07 VITALS — BP 120/80 | HR 80 | Temp 97.3°F | Resp 18 | Ht 64.5 in | Wt 160.0 lb

## 2022-09-07 DIAGNOSIS — Z7689 Persons encountering health services in other specified circumstances: Secondary | ICD-10-CM

## 2022-09-07 DIAGNOSIS — K219 Gastro-esophageal reflux disease without esophagitis: Secondary | ICD-10-CM

## 2022-09-07 DIAGNOSIS — E785 Hyperlipidemia, unspecified: Secondary | ICD-10-CM | POA: Diagnosis not present

## 2022-09-07 DIAGNOSIS — R42 Dizziness and giddiness: Secondary | ICD-10-CM | POA: Diagnosis not present

## 2022-09-07 DIAGNOSIS — I10 Essential (primary) hypertension: Secondary | ICD-10-CM

## 2022-09-07 DIAGNOSIS — Z1231 Encounter for screening mammogram for malignant neoplasm of breast: Secondary | ICD-10-CM

## 2022-09-07 DIAGNOSIS — Z1211 Encounter for screening for malignant neoplasm of colon: Secondary | ICD-10-CM

## 2022-09-07 DIAGNOSIS — R7303 Prediabetes: Secondary | ICD-10-CM

## 2022-09-07 MED ORDER — MECLIZINE HCL 25 MG PO TABS
25.0000 mg | ORAL_TABLET | Freq: Three times a day (TID) | ORAL | 0 refills | Status: AC | PRN
  Filled 2022-09-07: qty 30, 10d supply, fill #0

## 2022-09-07 NOTE — Patient Instructions (Signed)
Please get Shingles and COVID-19 booster vaccine at the pharmacy

## 2022-09-07 NOTE — Progress Notes (Signed)
Provider: Brealynn Contino FNP-C   Ivonne Andrew, NP  Patient Care Team: Ivonne Andrew, NP as PCP - General (Pulmonary Disease)  Extended Emergency Contact Information Primary Emergency Contact: Leccese,Lamar Address: [address]          [place], [place] [address] [place] Home Phone: [phone] Relation: Son  Code Status:  Full Code  Goals of care: Advanced Directive information    09/07/2022    2:20 PM  Advanced Directives  Does Patient Have a Medical Advance Directive? No  Would patient like information on creating a medical advance directive? No - Patient declined     Chief Complaint  Patient presents with   Establish Care    Patient is here to establish care and has medication bottles at initial appointment.   Medicare Wellness    Patient is due for pap smear, colonoscopy, and mammogram Also due for shingrix and covid booster    HPI:  Pt is a 56 y.o. female seen today establish care here at [facility] for medical management of chronic diseases.  Vertigo - request meclizine refill.Just return from vaccinations was on a boat and it restarted her vertigo.  Hypertension - no home B/p readings for review.denies any headache,dizziness,vision changes,fatigue,chest tightness,palpitation,chest pain or shortness of breath.     GERD - has been taking Omeprazole   Hyperlipidemia - tries to eat health.Does not exercise but states stays active going up and down the steps.   States recently completed prednisone for fluid in the knees which has improved.Does not know what caused the swelling.will follow up with Orthopedic.  Past Medical History:  Diagnosis Date   Carpal tunnel syndrome on both sides    Chronic foot pain, left    History of vertigo    Hyperglycemia 01/3020   Hypertension    Prediabetes 01/2020   Past Surgical History:  Procedure Laterality Date   EYE SURGERY      Allergies  Allergen Reactions   Ibuprofen  Shortness Of Breath    Allergies as of 09/07/2022       Reactions   Ibuprofen Shortness Of Breath        Medication List        Accurate as of September 07, 2022  2:41 PM. If you have any questions, ask your nurse or doctor.          cloNIDine 0.1 MG tablet Commonly known as: CATAPRES Take 1 tablet (0.1 mg total) by mouth 2 (two) times daily.   cyclobenzaprine 10 MG tablet Commonly known as: FLEXERIL Take 1 tablet (10 mg total) by mouth 3 (three) times daily as needed for muscle spasms.   hydrochlorothiazide 25 MG tablet Commonly known as: HYDRODIURIL Take 1 tablet (25 mg total) by mouth daily.   meloxicam 15 MG tablet Commonly known as: MOBIC Take 1 tablet (15 mg total) by mouth daily.   omeprazole 40 MG capsule Commonly known as: PRILOSEC Take 1 capsule (40 mg total) by mouth daily.        Review of Systems  Constitutional:  Negative for appetite change, chills, fatigue, fever and unexpected weight change.  HENT:  Negative for congestion, dental problem, ear discharge, ear pain, facial swelling, hearing loss, nosebleeds, postnasal drip, rhinorrhea, sinus pressure, sinus pain, sneezing, sore throat, tinnitus and trouble swallowing.   Eyes:  Negative for pain, discharge, redness, itching and visual disturbance.  Respiratory:  Negative for cough, chest tightness, shortness of breath and wheezing.  Cardiovascular:  Negative for chest pain, palpitations and leg swelling.  Gastrointestinal:  Negative for abdominal distention, abdominal pain, blood in stool, constipation, diarrhea, nausea and vomiting.  Endocrine: Negative for cold intolerance, heat intolerance, polydipsia, polyphagia and polyuria.  Genitourinary:  Negative for difficulty urinating, dysuria, flank pain, frequency and urgency.  Musculoskeletal:  Positive for arthralgias. Negative for back pain, gait problem, joint swelling, myalgias, neck pain and neck stiffness.       Left leg pain   Skin:  Negative  for color change, pallor, rash and wound.  Neurological:  Negative for dizziness, syncope, speech difficulty, weakness, light-headedness, numbness and headaches.  Hematological:  Does not bruise/bleed easily.  Psychiatric/Behavioral:  Negative for agitation, behavioral problems, confusion, hallucinations, self-injury, sleep disturbance and suicidal ideas. The patient is not nervous/anxious.     Immunization History  Administered Date(s) Administered   PFIZER(Purple Top)SARS-COV-2 Vaccination 01/22/2020, 02/12/2020   Tdap 05/18/2016   Pertinent  Health Maintenance Due  Topic Date Due   COLONOSCOPY (Pts 45-37yrs Insurance coverage will need to be confirmed)  Never done   MAMMOGRAM  04/25/2017   PAP SMEAR-Modifier  05/31/2020      08/20/2021    2:31 PM 08/20/2021    2:40 PM 02/11/2022    9:06 AM 08/13/2022    8:13 AM 09/07/2022    2:20 PM  Fall Risk  Falls in the past year? 0 0 0 0 0  Was there an injury with Fall? 0 0 0 0 0  Fall Risk Category Calculator 0 0 0 0 0  Fall Risk Category Low Low Low Low Low  Patient Fall Risk Level    Low fall risk Low fall risk  Patient at Risk for Falls Due to   No Fall Risks  No Fall Risks  Fall risk Follow up   Falls evaluation completed Falls evaluation completed Falls evaluation completed   Functional Status Survey:    Vitals:   09/07/22 1439  BP: 120/80  Pulse: 80  Resp: 18  Temp: (!) 97.3 F (36.3 C)  SpO2: 100%  Weight: 160 lb (72.6 kg)  Height: 5' 4.5" (1.638 m)   Body mass index is 27.04 kg/m. Physical Exam Vitals reviewed.  Constitutional:      General: She is not in acute distress.    Appearance: Normal appearance. She is overweight. She is not ill-appearing or diaphoretic.  HENT:     Head: Normocephalic.     Right Ear: Tympanic membrane, ear canal and external ear normal. There is no impacted cerumen.     Left Ear: Tympanic membrane, ear canal and external ear normal. There is no impacted cerumen.     Nose: Nose normal.  No congestion or rhinorrhea.     Mouth/Throat:     Mouth: Mucous membranes are moist.     Pharynx: Oropharynx is clear. No oropharyngeal exudate or posterior oropharyngeal erythema.  Eyes:     General: No scleral icterus.       Right eye: No discharge.        Left eye: No discharge.     Extraocular Movements: Extraocular movements intact.     Left eye: Nystagmus present.     Conjunctiva/sclera: Conjunctivae normal.     Pupils: Pupils are equal, round, and reactive to light.  Neck:     Vascular: No carotid bruit.  Cardiovascular:     Rate and Rhythm: Normal rate and regular rhythm.     Pulses: Normal pulses.     Heart sounds: Normal heart  sounds. No murmur heard.    No friction rub. No gallop.  Pulmonary:     Effort: Pulmonary effort is normal. No respiratory distress.     Breath sounds: Normal breath sounds. No wheezing, rhonchi or rales.  Chest:     Chest wall: No tenderness.  Abdominal:     General: Bowel sounds are normal. There is no distension.     Palpations: Abdomen is soft. There is no mass.     Tenderness: There is no abdominal tenderness. There is no right CVA tenderness, left CVA tenderness, guarding or rebound.  Musculoskeletal:        General: No swelling or tenderness. Normal range of motion.     Cervical back: Normal range of motion. No rigidity or tenderness.     Right lower leg: No edema.     Left lower leg: No edema.  Lymphadenopathy:     Cervical: No cervical adenopathy.  Skin:    General: Skin is warm and dry.     Coloration: Skin is not pale.     Findings: No bruising, erythema, lesion or rash.  Neurological:     Mental Status: She is alert and oriented to person, place, and time.     Cranial Nerves: No cranial nerve deficit.     Sensory: No sensory deficit.     Motor: No weakness.     Coordination: Coordination normal.     Gait: Gait normal.  Psychiatric:        Mood and Affect: Mood normal.        Speech: Speech normal.        Behavior: Behavior  normal.        Thought Content: Thought content normal.        Judgment: Judgment normal.     Labs reviewed: Recent Labs    02/11/22 1021  NA 142  K 3.8  CL 101  CO2 27  GLUCOSE 126*  BUN 14  CREATININE 0.86  CALCIUM 9.6   No results for input(s): "AST", "ALT", "ALKPHOS", "BILITOT", "PROT", "ALBUMIN" in the last 8760 hours. Recent Labs    02/11/22 1021  WBC 4.2  HGB 12.0  HCT 35.4  MCV 89  PLT 320   Lab Results  Component Value Date   TSH 0.550 01/22/2020   Lab Results  Component Value Date   HGBA1C 6.1 (A) 02/11/2022   HGBA1C 6.1 02/11/2022   HGBA1C 6.1 02/11/2022   HGBA1C 6.1 02/11/2022   Lab Results  Component Value Date   CHOL 216 (H) 02/11/2022   HDL 69 02/11/2022   LDLCALC 133 (H) 02/11/2022   TRIG 77 02/11/2022   CHOLHDL 3.1 02/11/2022    Significant Diagnostic Results in last 30 days:  No results found.  Assessment/Plan  1. Encounter to establish care Available records reviewed,will need to obtain immunization records then update on chart.Recommend fasting lab work  2. Essential hypertension B/p well controlled  - continue on hydrochlorothiazide and clonidine  - TSH; Future - COMPLETE METABOLIC PANEL WITH GFR; Future - CBC with Differential/Platelet; Future  3. Hyperlipidemia LDL goal <70 LDL 133 - Lipid panel; Future  4. Gastroesophageal reflux disease without esophagitis Continue in Omeprazole   5. Vertigo Start on meclizine   6. Prediabetes Latest Hgb A1C 6.1 - diet ary modification and exercise as advised  - Hemoglobin A1c; Future  7. Breast cancer screening by mammogram Asymptomatic  - MM 3D SCREEN BREAST BILATERAL; Future  8. Colon cancer screening Asymptomatic  - Colonoscopy verse  Cologuard discussed prefers Cologuard.  - Cologuard  Family/ staff Communication: Reviewed plan of care with patient verbalized understanding   Labs/tests ordered:  - MM 3D SCREEN BREAST BILATERAL; Future - Hemoglobin A1c; Future -  Lipid panel; Future - TSH; Future - COMPLETE METABOLIC PANEL WITH GFR; Future - CBC with Differential/Platelet; Future  Next Appointment : Return in about 6 months (around 03/08/2023) for medical mangement of chronic issues., fasting labs in one week and pap smear .   Sandrea Hughs, NP

## 2022-09-11 ENCOUNTER — Other Ambulatory Visit: Payer: Self-pay

## 2022-09-15 ENCOUNTER — Other Ambulatory Visit: Payer: Commercial Managed Care - HMO

## 2022-09-15 ENCOUNTER — Other Ambulatory Visit: Payer: Self-pay

## 2022-09-15 DIAGNOSIS — R7303 Prediabetes: Secondary | ICD-10-CM

## 2022-09-15 DIAGNOSIS — I10 Essential (primary) hypertension: Secondary | ICD-10-CM

## 2022-09-15 DIAGNOSIS — E785 Hyperlipidemia, unspecified: Secondary | ICD-10-CM

## 2022-09-15 LAB — CBC WITH DIFFERENTIAL/PLATELET
Absolute Monocytes: 319 cells/uL (ref 200–950)
Basophils Absolute: 21 cells/uL (ref 0–200)
Basophils Relative: 0.5 %
Eosinophils Absolute: 80 cells/uL (ref 15–500)
Eosinophils Relative: 1.9 %
HCT: 37 % (ref 35.0–45.0)
Hemoglobin: 12.5 g/dL (ref 11.7–15.5)
Lymphs Abs: 1907 cells/uL (ref 850–3900)
MCH: 30.2 pg (ref 27.0–33.0)
MCHC: 33.8 g/dL (ref 32.0–36.0)
MCV: 89.4 fL (ref 80.0–100.0)
MPV: 9.9 fL (ref 7.5–12.5)
Monocytes Relative: 7.6 %
Neutro Abs: 1873 cells/uL (ref 1500–7800)
Neutrophils Relative %: 44.6 %
Platelets: 383 10*3/uL (ref 140–400)
RBC: 4.14 10*6/uL (ref 3.80–5.10)
RDW: 13.3 % (ref 11.0–15.0)
Total Lymphocyte: 45.4 %
WBC: 4.2 10*3/uL (ref 3.8–10.8)

## 2022-09-15 LAB — LIPID PANEL
Cholesterol: 249 mg/dL — ABNORMAL HIGH (ref <200)
HDL: 69 mg/dL (ref >=50)
LDL Cholesterol (Calc): 154 mg/dL (calc) — ABNORMAL HIGH
Non-HDL Cholesterol (Calc): 180 mg/dL (calc) — ABNORMAL HIGH (ref <130)
Total CHOL/HDL Ratio: 3.6 (calc) (ref <5.0)
Triglycerides: 140 mg/dL (ref <150)

## 2022-09-15 LAB — COMPLETE METABOLIC PANEL WITH GFR
AG Ratio: 1.3 (calc) (ref 1.0–2.5)
ALT: 20 U/L (ref 6–29)
AST: 21 U/L (ref 10–35)
Albumin: 4.1 g/dL (ref 3.6–5.1)
Alkaline phosphatase (APISO): 61 U/L (ref 37–153)
BUN: 16 mg/dL (ref 7–25)
CO2: 33 mmol/L — ABNORMAL HIGH (ref 20–32)
Calcium: 9.8 mg/dL (ref 8.6–10.4)
Chloride: 101 mmol/L (ref 98–110)
Creat: 0.79 mg/dL (ref 0.50–1.03)
Globulin: 3.2 g/dL (calc) (ref 1.9–3.7)
Glucose, Bld: 106 mg/dL — ABNORMAL HIGH (ref 65–99)
Potassium: 4 mmol/L (ref 3.5–5.3)
Sodium: 140 mmol/L (ref 135–146)
Total Bilirubin: 0.5 mg/dL (ref 0.2–1.2)
Total Protein: 7.3 g/dL (ref 6.1–8.1)
eGFR: 87 mL/min/{1.73_m2} (ref >=60)

## 2022-09-15 LAB — TSH: TSH: 1.56 mIU/L (ref 0.40–4.50)

## 2022-09-15 LAB — HEMOGLOBIN A1C
Hgb A1c MFr Bld: 6.5 % of total Hgb — ABNORMAL HIGH (ref <5.7)
Mean Plasma Glucose: 140 mg/dL
eAG (mmol/L): 7.7 mmol/L

## 2022-09-21 ENCOUNTER — Other Ambulatory Visit: Payer: Self-pay

## 2022-09-22 ENCOUNTER — Ambulatory Visit: Payer: Commercial Managed Care - HMO | Admitting: Family

## 2022-09-30 LAB — COLOGUARD: COLOGUARD: NEGATIVE

## 2022-10-01 ENCOUNTER — Encounter: Payer: Commercial Managed Care - HMO | Admitting: Family

## 2022-10-01 NOTE — Progress Notes (Signed)
  This encounter was created in error - please disregard. No show 

## 2022-10-16 ENCOUNTER — Other Ambulatory Visit: Payer: Self-pay

## 2022-11-09 ENCOUNTER — Ambulatory Visit
Admission: RE | Admit: 2022-11-09 | Discharge: 2022-11-09 | Disposition: A | Discharge status: Home / Self Care | Payer: 59 | Source: Ambulatory Visit | Attending: Family | Admitting: Family

## 2022-11-09 DIAGNOSIS — Z1231 Encounter for screening mammogram for malignant neoplasm of breast: Secondary | ICD-10-CM | POA: Diagnosis not present

## 2022-11-16 ENCOUNTER — Other Ambulatory Visit: Payer: Self-pay

## 2022-12-23 ENCOUNTER — Other Ambulatory Visit: Payer: Self-pay

## 2023-01-25 ENCOUNTER — Other Ambulatory Visit: Payer: Self-pay

## 2023-02-10 ENCOUNTER — Ambulatory Visit: Payer: Self-pay | Admitting: Nurse Practitioner

## 2023-02-23 ENCOUNTER — Other Ambulatory Visit: Payer: Self-pay

## 2023-03-08 ENCOUNTER — Ambulatory Visit: Payer: Commercial Managed Care - HMO | Admitting: Family

## 2023-03-19 ENCOUNTER — Other Ambulatory Visit: Payer: Self-pay

## 2023-03-19 DIAGNOSIS — M25511 Pain in right shoulder: Secondary | ICD-10-CM | POA: Diagnosis not present

## 2023-03-19 MED ORDER — METHOCARBAMOL 500 MG PO TABS
500.0000 mg | ORAL_TABLET | Freq: Every evening | ORAL | 1 refills | Status: DC | PRN
  Filled 2023-03-19: qty 30, 30d supply, fill #0

## 2023-03-24 ENCOUNTER — Other Ambulatory Visit: Payer: Self-pay

## 2023-04-02 ENCOUNTER — Ambulatory Visit: Payer: 59 | Admitting: Nurse Practitioner

## 2023-04-15 DIAGNOSIS — M25511 Pain in right shoulder: Secondary | ICD-10-CM | POA: Diagnosis not present

## 2023-05-20 ENCOUNTER — Other Ambulatory Visit: Payer: Self-pay

## 2023-06-23 ENCOUNTER — Other Ambulatory Visit (HOSPITAL_COMMUNITY): Payer: Self-pay

## 2023-06-23 ENCOUNTER — Other Ambulatory Visit: Payer: Self-pay

## 2023-07-15 ENCOUNTER — Encounter: Payer: Self-pay | Admitting: Family

## 2023-07-15 ENCOUNTER — Ambulatory Visit (INDEPENDENT_AMBULATORY_CARE_PROVIDER_SITE_OTHER): Payer: 59 | Admitting: Family

## 2023-07-15 ENCOUNTER — Other Ambulatory Visit: Payer: Self-pay

## 2023-07-15 ENCOUNTER — Other Ambulatory Visit (HOSPITAL_COMMUNITY): Payer: Self-pay

## 2023-07-15 VITALS — BP 110/72 | HR 60 | Temp 98.0°F | Resp 16 | Ht 64.5 in | Wt 173.0 lb

## 2023-07-15 DIAGNOSIS — I10 Essential (primary) hypertension: Secondary | ICD-10-CM | POA: Diagnosis not present

## 2023-07-15 DIAGNOSIS — M25562 Pain in left knee: Secondary | ICD-10-CM | POA: Diagnosis not present

## 2023-07-15 DIAGNOSIS — G8929 Other chronic pain: Secondary | ICD-10-CM | POA: Diagnosis not present

## 2023-07-15 MED ORDER — CLONIDINE HCL 0.1 MG PO TABS
0.1000 mg | ORAL_TABLET | Freq: Two times a day (BID) | ORAL | 1 refills | Status: DC
  Filled 2023-07-15 – 2023-08-26 (×4): qty 60, 30d supply, fill #0

## 2023-07-15 MED ORDER — HYDROCHLOROTHIAZIDE 25 MG PO TABS
25.0000 mg | ORAL_TABLET | Freq: Every day | ORAL | 1 refills | Status: DC
Start: 2023-07-15 — End: 2024-01-11
  Filled 2023-07-15 (×2): qty 90, 90d supply, fill #0
  Filled 2023-07-19 – 2023-08-26 (×2): qty 30, 30d supply, fill #0
  Filled 2023-09-20: qty 30, 30d supply, fill #1
  Filled 2023-09-20: qty 30, 30d supply, fill #0
  Filled 2023-10-19: qty 30, 30d supply, fill #1
  Filled 2023-11-12 (×3): qty 30, 30d supply, fill #2
  Filled 2023-12-08 (×2): qty 30, 30d supply, fill #3

## 2023-07-15 NOTE — Progress Notes (Signed)
Provider: Richarda Blade FNP-C  Axle Parfait, Donalee Citrin, NP  Patient Care Team: Geovana Gebel, Donalee Citrin, NP as PCP - General (Family Medicine)  Extended Emergency Contact Information Primary Emergency Contact: Henrichs,Lemar Address: [address] [place]  [place] Relation: Son Secondary Emergency Contact: Nelson,Denise Mobile Phone: [phone] Relation: Friend  Code Status:  Full Code  Goals of care: Advanced Directive information    07/15/2023   11:25 AM  Advanced Directives  Does Patient Have a Medical Advance Directive? No     Chief Complaint  Patient presents with   Follow-up    Discuss swelling in left leg    HPI:  Pt is a 57 y.o. female seen today for an acute visit for evaluation of left knee swelling.states has chronic knee pain. She denies any redness or injury to knee.Also states no fever or chills.   Past Medical History:  Diagnosis Date   Carpal tunnel syndrome on both sides    Chronic foot pain, left    History of vertigo    Hyperglycemia 01/3020   Hypertension    Prediabetes 01/2020   Past Surgical History:  Procedure Laterality Date   EYE SURGERY      Allergies  Allergen Reactions   Ibuprofen Shortness Of Breath    Outpatient Encounter Medications as of 07/15/2023  Medication Sig   cloNIDine (CATAPRES) 0.1 MG tablet Take 1 tablet (0.1 mg total) by mouth 2 (two) times daily.   hydrochlorothiazide (HYDRODIURIL) 25 MG tablet Take 1 tablet (25 mg total) by mouth daily.   meclizine (ANTIVERT) 25 MG tablet Take 1 tablet (25 mg total) by mouth 3 (three) times daily as needed for dizziness.   meloxicam (MOBIC) 15 MG tablet Take 1 tablet (15 mg total) by mouth daily.   methocarbamol (ROBAXIN) 500 MG tablet Take 1 tablet (500 mg total) by mouth at bedtime as needed.   omeprazole (PRILOSEC) 40 MG capsule Take 1 capsule (40 mg total) by mouth daily.   No facility-administered encounter medications on file as of 07/15/2023.    Review of Systems   Constitutional:  Negative for appetite change, chills, fatigue, fever and unexpected weight change.  Respiratory:  Negative for cough, chest tightness, shortness of breath and wheezing.   Cardiovascular:  Negative for chest pain, palpitations and leg swelling.  Gastrointestinal:  Negative for abdominal distention, abdominal pain, constipation, nausea and vomiting.  Musculoskeletal:  Positive for arthralgias and joint swelling. Negative for back pain, gait problem, myalgias, neck pain and neck stiffness.       Knee swelling.  Skin:  Negative for color change, pallor and rash.  Neurological:  Negative for dizziness, weakness, light-headedness, numbness and headaches.    Immunization History  Administered Date(s) Administered   PFIZER(Purple Top)SARS-COV-2 Vaccination 01/22/2020, 02/12/2020   Tdap 05/18/2016   Pertinent  Health Maintenance Due  Topic Date Due   Colonoscopy  Never done   PAP SMEAR-Modifier  05/31/2020   MAMMOGRAM  11/09/2024      08/20/2021    2:40 PM 02/11/2022    9:06 AM 08/13/2022    8:13 AM 09/07/2022    2:20 PM 07/15/2023   11:25 AM  Fall Risk  Falls in the past year? 0 0 0 0 0  Was there an injury with Fall? 0 0 0 0   Fall Risk Category Calculator 0 0 0 0   Fall Risk Category (Retired) Low Low Low Low   (RETIRED) Patient Fall Risk Level   Low fall risk Low fall risk  Patient at Risk for Falls Due to  No Fall Risks  No Fall Risks   Fall risk Follow up  Falls evaluation completed Falls evaluation completed Falls evaluation completed    Functional Status Survey:    Vitals:   07/15/23 1126  BP: 110/72  Pulse: 60  Resp: 16  Temp: 98 F (36.7 C)  SpO2: 98%  Weight: 173 lb (78.5 kg)  Height: 5' 4.5" (1.638 m)   Body mass index is 29.24 kg/m. Physical Exam Vitals reviewed.  Constitutional:      General: She is not in acute distress.    Appearance: Normal appearance. She is overweight. She is not ill-appearing or diaphoretic.  HENT:     Head:  Normocephalic.     Mouth/Throat:     Mouth: Mucous membranes are moist.     Pharynx: Oropharynx is clear. No oropharyngeal exudate or posterior oropharyngeal erythema.  Eyes:     General: No scleral icterus.       Right eye: No discharge.        Left eye: No discharge.     Conjunctiva/sclera: Conjunctivae normal.     Pupils: Pupils are equal, round, and reactive to light.  Neck:     Vascular: No carotid bruit.  Cardiovascular:     Rate and Rhythm: Normal rate and regular rhythm.     Pulses: Normal pulses.     Heart sounds: Normal heart sounds. No murmur heard.    No friction rub. No gallop.  Pulmonary:     Effort: Pulmonary effort is normal. No respiratory distress.     Breath sounds: Normal breath sounds. No wheezing, rhonchi or rales.  Chest:     Chest wall: No tenderness.  Abdominal:     General: Bowel sounds are normal. There is no distension.     Palpations: Abdomen is soft. There is no mass.     Tenderness: There is no abdominal tenderness. There is no right CVA tenderness, left CVA tenderness, guarding or rebound.  Musculoskeletal:        General: No swelling. Normal range of motion.     Cervical back: Normal range of motion. No rigidity or tenderness.     Right knee: Normal.     Left knee: Swelling and crepitus present. No erythema. Tenderness present.     Right lower leg: No edema.     Left lower leg: No edema.  Lymphadenopathy:     Cervical: No cervical adenopathy.  Skin:    General: Skin is warm and dry.     Coloration: Skin is not pale.     Findings: No bruising, erythema, lesion or rash.  Neurological:     Mental Status: She is alert and oriented to person, place, and time.     Cranial Nerves: No cranial nerve deficit.     Sensory: No sensory deficit.     Motor: No weakness.     Coordination: Coordination normal.     Gait: Gait normal.  Psychiatric:        Mood and Affect: Mood normal.        Speech: Speech normal.        Behavior: Behavior normal.         Thought Content: Thought content normal.        Judgment: Judgment normal.     Labs reviewed: Recent Labs    09/15/22 0807  NA 140  K 4.0  CL 101  CO2 33*  GLUCOSE 106*  BUN 16  CREATININE  0.79  CALCIUM 9.8   Recent Labs    09/15/22 0807  AST 21  ALT 20  BILITOT 0.5  PROT 7.3   Recent Labs    09/15/22 0807  WBC 4.2  NEUTROABS 1,873  HGB 12.5  HCT 37.0  MCV 89.4  PLT 383   Lab Results  Component Value Date   TSH 1.56 09/15/2022   Lab Results  Component Value Date   HGBA1C 6.5 (H) 09/15/2022   Lab Results  Component Value Date   CHOL 249 (H) 09/15/2022   HDL 69 09/15/2022   LDLCALC 154 (H) 09/15/2022   TRIG 140 09/15/2022   CHOLHDL 3.6 09/15/2022    Significant Diagnostic Results in last 30 days:  No results found.  Assessment/Plan  1. Chronic pain of left knee Swollen,tender with crepitus noted.No erythema.  - DG Knee Complete 4 Views Left; Future  2. Essential hypertension B/p well controlled  - add hydrochlorothiazide  - cloNIDine (CATAPRES) 0.1 MG tablet; Take 1 tablet (0.1 mg total) by mouth 2 (two) times daily.  Dispense: 60 tablet; Refill: 1 - hydrochlorothiazide (HYDRODIURIL) 25 MG tablet; Take 1 tablet (25 mg total) by mouth daily.  Dispense: 90 tablet; Refill: 1  Family/ staff Communication: Reviewed plan of care with patient verbalized understanding   Labs/tests ordered: - DG Knee Complete 4 Views Left; Future  Next Appointment: Return in about 9 weeks (around 09/16/2023) for annual Physical examination and fasting labs same day .   Caesar Bookman, NP

## 2023-07-16 ENCOUNTER — Ambulatory Visit
Admission: RE | Admit: 2023-07-16 | Discharge: 2023-07-16 | Disposition: A | Discharge status: Home / Self Care | Payer: 59 | Source: Ambulatory Visit | Attending: Family | Admitting: Family

## 2023-07-16 DIAGNOSIS — G8929 Other chronic pain: Secondary | ICD-10-CM

## 2023-07-16 DIAGNOSIS — M1712 Unilateral primary osteoarthritis, left knee: Secondary | ICD-10-CM | POA: Diagnosis not present

## 2023-07-19 ENCOUNTER — Other Ambulatory Visit: Payer: Self-pay

## 2023-07-26 ENCOUNTER — Other Ambulatory Visit (HOSPITAL_COMMUNITY): Payer: Self-pay

## 2023-07-26 ENCOUNTER — Other Ambulatory Visit: Payer: Self-pay

## 2023-08-02 ENCOUNTER — Other Ambulatory Visit: Payer: Self-pay

## 2023-08-02 DIAGNOSIS — M1710 Unilateral primary osteoarthritis, unspecified knee: Secondary | ICD-10-CM

## 2023-08-03 ENCOUNTER — Telehealth: Payer: Self-pay

## 2023-08-03 NOTE — Telephone Encounter (Signed)
Left a detailed message on voicemail for patient informing her of providers response

## 2023-08-03 NOTE — Telephone Encounter (Signed)
Message left on clinical intake voicemail:   Patient with ongoing swelling in left leg, states "Leg is hugh" and is requesting a medication to help with the swelling. Patient will not see specialist until 08/16/23  Please advise

## 2023-08-03 NOTE — Telephone Encounter (Signed)
Please schedule appointment to evaluation swelling on the leg.

## 2023-08-04 ENCOUNTER — Other Ambulatory Visit: Payer: Self-pay

## 2023-08-04 ENCOUNTER — Ambulatory Visit (INDEPENDENT_AMBULATORY_CARE_PROVIDER_SITE_OTHER): Payer: 59 | Admitting: Family

## 2023-08-04 ENCOUNTER — Encounter: Payer: Self-pay | Admitting: Family

## 2023-08-04 VITALS — BP 118/72 | HR 98 | Temp 97.6°F | Resp 18 | Ht 64.5 in | Wt 165.7 lb

## 2023-08-04 DIAGNOSIS — M25562 Pain in left knee: Secondary | ICD-10-CM | POA: Diagnosis not present

## 2023-08-04 DIAGNOSIS — G8929 Other chronic pain: Secondary | ICD-10-CM

## 2023-08-04 MED ORDER — PREDNISONE 20 MG PO TABS
ORAL_TABLET | ORAL | 0 refills | Status: AC
Start: 2023-08-04 — End: 2023-08-08
  Filled 2023-08-04 (×2): qty 5, 4d supply, fill #0

## 2023-08-04 NOTE — Progress Notes (Signed)
Provider: Richarda Blade FNP-C  Minetta Krisher, Donalee Citrin, NP  Patient Care Team: Ashby Leflore, Donalee Citrin, NP as PCP - General (Family Medicine)  Extended Emergency Contact Information Primary Emergency Contact: Lundin,Lemar Address: [address] [place]  [place] Phone: [phone] Relation: Son Secondary Emergency Contact: Nelson,Denise Mobile Phone: [phone] Relation: Friend  Code Status:  Full Code  Goals of care: Advanced Directive information    08/04/2023    1:47 PM  Advanced Directives  Does Patient Have a Medical Advance Directive? No  Would patient like information on creating a medical advance directive? No - Patient declined     Chief Complaint  Patient presents with   Acute Visit    Patient is here for swelling of the left knee. Patient states she was referred to a specialist with an appointment on 08/16/2023 but she is having pain    HPI:  Pt is a 57 y.o. female seen today for an acute visit for evaluation of left knee pain and swelling.states pain and swelling has worsen.request prednisone to help with pain.states took prednisone one year ago which helped.she was referred to orthopedic but appointment was set for 08/16/2023 though per chart appointment is scheduled with Dr.Blackman on 08/23/2023.  She denies any fever,chills,redness or leg giving out.    Past Medical History:  Diagnosis Date   Carpal tunnel syndrome on both sides    Chronic foot pain, left    History of vertigo    Hyperglycemia 01/3020   Hypertension    Prediabetes 01/2020   Past Surgical History:  Procedure Laterality Date   EYE SURGERY      Allergies  Allergen Reactions   Ibuprofen Shortness Of Breath    Outpatient Encounter Medications as of 08/04/2023  Medication Sig   cloNIDine (CATAPRES) 0.1 MG tablet Take 1 tablet (0.1 mg total) by mouth 2 (two) times daily.   hydrochlorothiazide (HYDRODIURIL) 25 MG tablet Take 1 tablet (25 mg total) by mouth daily.   meclizine  (ANTIVERT) 25 MG tablet Take 1 tablet (25 mg total) by mouth 3 (three) times daily as needed for dizziness.   meloxicam (MOBIC) 15 MG tablet Take 1 tablet (15 mg total) by mouth daily.   methocarbamol (ROBAXIN) 500 MG tablet Take 1 tablet (500 mg total) by mouth at bedtime as needed.   omeprazole (PRILOSEC) 40 MG capsule Take 1 capsule (40 mg total) by mouth daily.   No facility-administered encounter medications on file as of 08/04/2023.    Review of Systems  Constitutional:  Negative for appetite change, chills, fatigue, fever and unexpected weight change.  Respiratory:  Negative for cough, chest tightness, shortness of breath and wheezing.   Cardiovascular:  Negative for chest pain, palpitations and leg swelling.  Musculoskeletal:  Positive for arthralgias and joint swelling. Negative for back pain, gait problem and myalgias.  Skin:  Negative for color change, pallor and rash.  Neurological:  Negative for weakness and numbness.    Immunization History  Administered Date(s) Administered   PFIZER(Purple Top)SARS-COV-2 Vaccination 01/22/2020, 02/12/2020   Tdap 05/18/2016   Pertinent  Health Maintenance Due  Topic Date Due   Colonoscopy  Never done   MAMMOGRAM  11/09/2024      02/11/2022    9:06 AM 08/13/2022    8:13 AM 09/07/2022    2:20 PM 07/15/2023   11:25 AM 08/04/2023    1:46 PM  Fall Risk  Falls in the past year? 0 0 0 0 0  Was there an injury with Fall?  0 0 0  0  Fall Risk Category Calculator 0 0 0  0  Fall Risk Category (Retired) Low Low Low    (RETIRED) Patient Fall Risk Level  Low fall risk Low fall risk    Patient at Risk for Falls Due to No Fall Risks  No Fall Risks  No Fall Risks  Fall risk Follow up Falls evaluation completed Falls evaluation completed Falls evaluation completed  Falls evaluation completed   Functional Status Survey:    Vitals:   08/04/23 1344  BP: 118/72  Pulse: 98  Resp: 18  Temp: 97.6 F (36.4 C)  TempSrc: Temporal  Weight: 165 lb  11.2 oz (75.2 kg)  Height: 5' 4.5" (1.638 m)   Body mass index is 28 kg/m. Physical Exam Vitals reviewed.  Constitutional:      General: She is not in acute distress.    Appearance: Normal appearance. She is normal weight. She is not ill-appearing or diaphoretic.  Eyes:     General: No scleral icterus.       Right eye: No discharge.        Left eye: No discharge.     Conjunctiva/sclera: Conjunctivae normal.     Pupils: Pupils are equal, round, and reactive to light.  Cardiovascular:     Rate and Rhythm: Normal rate and regular rhythm.     Pulses: Normal pulses.     Heart sounds: Normal heart sounds. No murmur heard.    No friction rub. No gallop.  Pulmonary:     Effort: Pulmonary effort is normal. No respiratory distress.     Breath sounds: Normal breath sounds. No wheezing, rhonchi or rales.  Chest:     Chest wall: No tenderness.  Musculoskeletal:        General: No swelling.     Right knee: Normal.     Left knee: Swelling and crepitus present. No effusion, erythema or ecchymosis. Decreased range of motion. Tenderness present.     Instability Tests: Anterior drawer test negative.     Right lower leg: No edema.     Left lower leg: No edema.  Skin:    General: Skin is warm and dry.     Coloration: Skin is not pale.     Findings: No erythema or rash.  Neurological:     Mental Status: She is alert and oriented to person, place, and time.     Sensory: No sensory deficit.     Motor: No weakness.     Gait: Gait normal.  Psychiatric:        Mood and Affect: Mood normal.        Speech: Speech normal.        Behavior: Behavior normal.     Labs reviewed: Recent Labs    09/15/22 0807  NA 140  K 4.0  CL 101  CO2 33*  GLUCOSE 106*  BUN 16  CREATININE 0.79  CALCIUM 9.8   Recent Labs    09/15/22 0807  AST 21  ALT 20  BILITOT 0.5  PROT 7.3   Recent Labs    09/15/22 0807  WBC 4.2  NEUTROABS 1,873  HGB 12.5  HCT 37.0  MCV 89.4  PLT 383   Lab Results   Component Value Date   TSH 1.56 09/15/2022   Lab Results  Component Value Date   HGBA1C 6.5 (H) 09/15/2022   Lab Results  Component Value Date   CHOL 249 (H) 09/15/2022   HDL 69 09/15/2022  LDLCALC 154 (H) 09/15/2022   TRIG 140 09/15/2022   CHOLHDL 3.6 09/15/2022    Significant Diagnostic Results in last 30 days:  DG Knee Complete 4 Views Left  Result Date: 07/31/2023 CLINICAL DATA:  Left knee pain.  Pain for 3 months with swelling. EXAM: LEFT KNEE - COMPLETE 4+ VIEW COMPARISON:  None Available. FINDINGS: The alignment joint spaces are preserved. There is mild tricompartmental peripheral spurring. No significant knee joint effusion. No fracture. No erosion or focal bone abnormality. Unremarkable soft tissues. IMPRESSION: Mild tricompartmental osteoarthritis. Electronically Signed   By: Narda Rutherford M.D.   On: 07/31/2023 16:19    Assessment/Plan  Chronic pain of left knee Knee tender to palpation with effusion.No erythema,ecchymosis noted. - will start on Tapered prednisone as below side effects discussed -Follow up with Orthopedic.consider scheduling a sooner appointment  - predniSONE (DELTASONE) 20 MG tablet; Take 2 tablets (40 mg total) by mouth daily with breakfast for 1 day, THEN 1.5 tablets (30 mg total) daily with breakfast for 1 day, THEN 1 tablet (20 mg total) daily with breakfast for 1 day, THEN 0.5 tablets (10 mg total) daily with breakfast for 1 day.  Dispense: 5 tablet; Refill: 0  Family/ staff Communication: Reviewed plan of care with patient verbalized understanding   Labs/tests ordered: None   Next Appointment: Return if symptoms worsen or fail to improve.   Caesar Bookman, NP

## 2023-08-04 NOTE — Telephone Encounter (Signed)
Patient is being seen today

## 2023-08-23 ENCOUNTER — Ambulatory Visit: Payer: 59 | Admitting: Orthopaedic Surgery

## 2023-08-26 ENCOUNTER — Other Ambulatory Visit: Payer: Self-pay

## 2023-08-26 ENCOUNTER — Other Ambulatory Visit: Payer: Self-pay | Admitting: Nurse Practitioner

## 2023-08-26 DIAGNOSIS — K219 Gastro-esophageal reflux disease without esophagitis: Secondary | ICD-10-CM

## 2023-08-26 DIAGNOSIS — M79605 Pain in left leg: Secondary | ICD-10-CM

## 2023-08-26 MED ORDER — MELOXICAM 15 MG PO TABS
15.0000 mg | ORAL_TABLET | Freq: Every day | ORAL | 11 refills | Status: DC
  Filled 2023-08-26: qty 30, 30d supply, fill #0
  Filled 2023-09-20: qty 30, 30d supply, fill #1
  Filled 2023-09-20: qty 30, 30d supply, fill #0
  Filled 2023-10-19: qty 30, 30d supply, fill #1
  Filled 2023-11-12 (×2): qty 30, 30d supply, fill #2
  Filled 2023-12-08 (×2): qty 30, 30d supply, fill #3
  Filled 2024-01-11: qty 30, 30d supply, fill #4
  Filled 2024-01-11: qty 30, 30d supply, fill #0
  Filled 2024-02-07: qty 30, 30d supply, fill #1
  Filled 2024-02-07: qty 30, 30d supply, fill #0
  Filled 2024-03-08: qty 30, 30d supply, fill #1
  Filled 2024-03-08: qty 30, 30d supply, fill #0
  Filled 2024-04-05: qty 30, 30d supply, fill #1
  Filled 2024-05-16: qty 30, 30d supply, fill #2
  Filled 2024-06-14 (×2): qty 30, 30d supply, fill #3
  Filled 2024-07-11: qty 30, 30d supply, fill #4

## 2023-08-26 MED ORDER — OMEPRAZOLE 40 MG PO CPDR
40.0000 mg | DELAYED_RELEASE_CAPSULE | Freq: Every day | ORAL | 11 refills | Status: DC
  Filled 2023-08-26 – 2023-09-20 (×2): qty 30, 30d supply, fill #0
  Filled 2023-09-20 – 2023-10-19 (×2): qty 30, 30d supply, fill #1
  Filled 2023-11-12 (×2): qty 30, 30d supply, fill #2
  Filled 2023-12-08 (×2): qty 30, 30d supply, fill #3
  Filled 2024-01-11: qty 30, 30d supply, fill #0
  Filled 2024-01-11: qty 30, 30d supply, fill #4
  Filled 2024-02-07: qty 30, 30d supply, fill #0
  Filled 2024-02-07 – 2024-03-08 (×2): qty 30, 30d supply, fill #1
  Filled 2024-03-08: qty 30, 30d supply, fill #0
  Filled 2024-04-05: qty 30, 30d supply, fill #1
  Filled 2024-05-16: qty 30, 30d supply, fill #2
  Filled 2024-06-14 (×2): qty 30, 30d supply, fill #3
  Filled 2024-07-11: qty 30, 30d supply, fill #4

## 2023-09-16 ENCOUNTER — Encounter: Payer: 59 | Admitting: Family

## 2023-09-20 ENCOUNTER — Other Ambulatory Visit: Payer: Self-pay

## 2023-09-20 ENCOUNTER — Encounter: Payer: 59 | Admitting: Family

## 2023-09-20 ENCOUNTER — Other Ambulatory Visit (HOSPITAL_COMMUNITY): Payer: Self-pay

## 2023-09-20 ENCOUNTER — Other Ambulatory Visit: Payer: Self-pay | Admitting: Family

## 2023-09-20 DIAGNOSIS — I10 Essential (primary) hypertension: Secondary | ICD-10-CM

## 2023-09-20 MED ORDER — CLONIDINE HCL 0.1 MG PO TABS
0.1000 mg | ORAL_TABLET | Freq: Two times a day (BID) | ORAL | 0 refills | Status: DC
Start: 2023-09-20 — End: 2023-10-19
  Filled 2023-09-20 (×2): qty 60, 30d supply, fill #0

## 2023-09-21 ENCOUNTER — Other Ambulatory Visit (HOSPITAL_COMMUNITY): Payer: Self-pay

## 2023-09-24 NOTE — Progress Notes (Signed)
  This encounter was created in error - please disregard. No show 

## 2023-09-26 NOTE — Progress Notes (Signed)
  This encounter was created in error - please disregard. No show 

## 2023-10-05 ENCOUNTER — Other Ambulatory Visit (HOSPITAL_COMMUNITY): Payer: Self-pay

## 2023-10-05 ENCOUNTER — Ambulatory Visit (INDEPENDENT_AMBULATORY_CARE_PROVIDER_SITE_OTHER): Payer: 59 | Admitting: Family

## 2023-10-05 ENCOUNTER — Encounter: Payer: Self-pay | Admitting: Family

## 2023-10-05 ENCOUNTER — Other Ambulatory Visit: Payer: Self-pay

## 2023-10-05 VITALS — BP 127/88 | HR 79 | Temp 97.5°F | Resp 18 | Ht 64.5 in | Wt 161.0 lb

## 2023-10-05 DIAGNOSIS — R7303 Prediabetes: Secondary | ICD-10-CM

## 2023-10-05 DIAGNOSIS — K219 Gastro-esophageal reflux disease without esophagitis: Secondary | ICD-10-CM

## 2023-10-05 DIAGNOSIS — R0981 Nasal congestion: Secondary | ICD-10-CM

## 2023-10-05 DIAGNOSIS — E119 Type 2 diabetes mellitus without complications: Secondary | ICD-10-CM

## 2023-10-05 DIAGNOSIS — I10 Essential (primary) hypertension: Secondary | ICD-10-CM

## 2023-10-05 DIAGNOSIS — E785 Hyperlipidemia, unspecified: Secondary | ICD-10-CM

## 2023-10-05 MED ORDER — FLUTICASONE PROPIONATE 50 MCG/ACT NA SUSP
2.0000 | Freq: Every day | NASAL | 6 refills | Status: DC
  Filled 2023-10-05: qty 16, 30d supply, fill #0
  Filled 2023-10-05: qty 16, fill #0

## 2023-10-05 NOTE — Progress Notes (Unsigned)
Provider: Richarda Blade FNP-C   Enma Maeda, Donalee Citrin, NP  Patient Care Team: Rishard Delange, Donalee Citrin, NP as PCP - General (Family Medicine)  Extended Emergency Contact Information Primary Emergency Contact: Tramell,Lemar Address: [address] [place]  [place] Phone: [phone] Relation: Son Secondary Emergency Contact: Nelson,Denise Mobile Phone: [phone] Relation: Friend  Code Status:  Full Code  Goals of care: Advanced Directive information    08/04/2023    1:47 PM  Advanced Directives  Does Patient Have a Medical Advance Directive? No  Would patient like information on creating a medical advance directive? No - Patient declined     Chief Complaint  Patient presents with   Annual Exam    Physical exam   Immunizations    Shingrix and Covid   Health Maintenance    Cervical Cancer Screening and Colonoscopy    HPI:  Pt is a 57 y.o. female seen today for medical management of chronic diseases.   Type 2 DM - previous Glucose 106 and A1C 6.5 states has stopped eating sweets.   Hyperlipidemia - previous Cholesterol 249 ,TRG 140 and LDL 154   She complains of head and nasal congestion x 2 days.No exposure to sick person with COVID-19. She denies any fever,chills,cough,fatigue,body aches,runny nose,chest tightness,chest pain,palpitation or shortness of breath.  Due for shingles and COVID-19 vaccine aware to get at the pharmacy.   Past Medical History:  Diagnosis Date   Carpal tunnel syndrome on both sides    Chronic foot pain, left    History of vertigo    Hyperglycemia 01/3020   Hypertension    Prediabetes 01/2020   Past Surgical History:  Procedure Laterality Date   EYE SURGERY      Allergies  Allergen Reactions   Ibuprofen Shortness Of Breath    Allergies as of 10/05/2023       Reactions   Ibuprofen Shortness Of Breath        Medication List        Accurate as of October 05, 2023  1:49 PM. If you have any questions, ask your nurse or  doctor.          cloNIDine 0.1 MG tablet Commonly known as: CATAPRES Take 1 tablet (0.1 mg total) by mouth 2 (two) times daily.   hydrochlorothiazide 25 MG tablet Commonly known as: HYDRODIURIL Take 1 tablet (25 mg total) by mouth daily.   meclizine 25 MG tablet Commonly known as: ANTIVERT Take 1 tablet (25 mg total) by mouth 3 (three) times daily as needed for dizziness.   meloxicam 15 MG tablet Commonly known as: MOBIC Take 1 tablet (15 mg total) by mouth daily.   methocarbamol 500 MG tablet Commonly known as: ROBAXIN Take 1 tablet (500 mg total) by mouth at bedtime as needed.   omeprazole 40 MG capsule Commonly known as: PRILOSEC Take 1 capsule (40 mg total) by mouth daily.        Review of Systems  Constitutional:  Negative for appetite change, chills, fatigue, fever and unexpected weight change.  HENT:  Positive for congestion. Negative for dental problem, ear discharge, ear pain, facial swelling, hearing loss, nosebleeds, postnasal drip, rhinorrhea, sinus pressure, sinus pain, sneezing, sore throat, tinnitus and trouble swallowing.   Eyes:  Negative for pain, discharge, redness, itching and visual disturbance.  Respiratory:  Negative for cough, chest tightness, shortness of breath and wheezing.   Cardiovascular:  Negative for chest pain, palpitations and leg swelling.  Gastrointestinal:  Negative for abdominal distention, abdominal pain,  blood in stool, constipation, diarrhea, nausea and vomiting.  Endocrine: Negative for cold intolerance, heat intolerance, polydipsia, polyphagia and polyuria.  Genitourinary:  Negative for difficulty urinating, dysuria, flank pain, frequency and urgency.  Musculoskeletal:  Negative for arthralgias, back pain, gait problem, joint swelling, myalgias, neck pain and neck stiffness.  Skin:  Negative for color change, pallor, rash and wound.  Neurological:  Negative for dizziness, syncope, speech difficulty, weakness, light-headedness,  numbness and headaches.  Hematological:  Does not bruise/bleed easily.  Psychiatric/Behavioral:  Negative for agitation, behavioral problems, confusion, hallucinations, self-injury, sleep disturbance and suicidal ideas. The patient is not nervous/anxious.     Immunization History  Administered Date(s) Administered   PFIZER(Purple Top)SARS-COV-2 Vaccination 01/22/2020, 02/12/2020   Tdap 05/18/2016   Pertinent  Health Maintenance Due  Topic Date Due   Colonoscopy  Never done   MAMMOGRAM  11/09/2024      08/13/2022    8:13 AM 09/07/2022    2:20 PM 07/15/2023   11:25 AM 08/04/2023    1:46 PM 10/05/2023    1:23 PM  Fall Risk  Falls in the past year? 0 0 0 0 0  Was there an injury with Fall? 0 0  0 0  Fall Risk Category Calculator 0 0  0 0  Fall Risk Category (Retired) Low Low     (RETIRED) Patient Fall Risk Level Low fall risk Low fall risk     Patient at Risk for Falls Due to  No Fall Risks  No Fall Risks No Fall Risks  Fall risk Follow up Falls evaluation completed Falls evaluation completed  Falls evaluation completed Falls evaluation completed   Functional Status Survey:    Vitals:   10/05/23 1324  BP: 127/88  Pulse: 79  Resp: 18  Temp: (!) 97.5 F (36.4 C)  SpO2: 99%  Weight: 161 lb (73 kg)  Height: 5' 4.5" (1.638 m)   Body mass index is 27.21 kg/m. Physical Exam Vitals reviewed.  Constitutional:      General: She is not in acute distress.    Appearance: Normal appearance. She is normal weight. She is not ill-appearing or diaphoretic.  HENT:     Head: Normocephalic.     Right Ear: Tympanic membrane, ear canal and external ear normal. There is no impacted cerumen.     Left Ear: Tympanic membrane, ear canal and external ear normal. There is no impacted cerumen.     Nose: Congestion present. No rhinorrhea.     Right Turbinates: Swollen.     Left Turbinates: Swollen.     Mouth/Throat:     Mouth: Mucous membranes are moist.     Pharynx: Oropharynx is clear. No  oropharyngeal exudate or posterior oropharyngeal erythema.  Eyes:     General: No scleral icterus.       Right eye: No discharge.        Left eye: No discharge.     Extraocular Movements: Extraocular movements intact.     Conjunctiva/sclera: Conjunctivae normal.     Pupils: Pupils are equal, round, and reactive to light.  Neck:     Vascular: No carotid bruit.  Cardiovascular:     Rate and Rhythm: Normal rate and regular rhythm.     Pulses: Normal pulses.     Heart sounds: Normal heart sounds. No murmur heard.    No friction rub. No gallop.  Pulmonary:     Effort: Pulmonary effort is normal. No respiratory distress.     Breath sounds: Normal breath sounds. No  wheezing, rhonchi or rales.  Chest:     Chest wall: No tenderness.  Abdominal:     General: Bowel sounds are normal. There is no distension.     Palpations: Abdomen is soft. There is no mass.     Tenderness: There is no abdominal tenderness. There is no right CVA tenderness, left CVA tenderness, guarding or rebound.  Musculoskeletal:        General: No swelling or tenderness. Normal range of motion.     Cervical back: Normal range of motion. No rigidity or tenderness.     Right lower leg: No edema.     Left lower leg: No edema.  Lymphadenopathy:     Cervical: No cervical adenopathy.  Skin:    General: Skin is warm and dry.     Coloration: Skin is not pale.     Findings: No bruising, erythema, lesion or rash.  Neurological:     Mental Status: She is alert and oriented to person, place, and time.     Cranial Nerves: No cranial nerve deficit.     Sensory: No sensory deficit.     Motor: No weakness.     Coordination: Coordination normal.     Gait: Gait normal.  Psychiatric:        Mood and Affect: Mood normal.        Speech: Speech normal.        Behavior: Behavior normal.        Thought Content: Thought content normal.        Judgment: Judgment normal.     Labs reviewed: No results for input(s): "NA", "K", "CL",  "CO2", "GLUCOSE", "BUN", "CREATININE", "CALCIUM", "MG", "PHOS" in the last 8760 hours. No results for input(s): "AST", "ALT", "ALKPHOS", "BILITOT", "PROT", "ALBUMIN" in the last 8760 hours. No results for input(s): "WBC", "NEUTROABS", "HGB", "HCT", "MCV", "PLT" in the last 8760 hours. Lab Results  Component Value Date   TSH 1.56 09/15/2022   Lab Results  Component Value Date   HGBA1C 6.5 (H) 09/15/2022   Lab Results  Component Value Date   CHOL 249 (H) 09/15/2022   HDL 69 09/15/2022   LDLCALC 154 (H) 09/15/2022   TRIG 140 09/15/2022   CHOLHDL 3.6 09/15/2022    Significant Diagnostic Results in last 30 days:  No results found.  Assessment/Plan 1. Essential hypertension B/p well controlled  - continue dietary modification and exercise at least three times per week for 30 minutes.  -Continue hydrochlorothiazide - Lipid panel - TSH - COMPLETE METABOLIC PANEL WITH GFR - CBC with Differential/Platelet - Hemoglobin A1c  2. Hyperlipidemia LDL goal <70  LDL 154 not at goal -Dietary modification and exercise advised as above.  Consider starting on statin if levels does not improve - Lipid panel  3. Gastroesophageal reflux disease without esophagitis Symptoms controlled. H/H stable.No tarry or black stool  - advised to avoid eating meals late in the evening and to avoid aggravating foods and spices. - continue on Omeprazole  - CBC with Differential/Platelet  4. Type 2 Diabetes mellitus  Lab Results  Component Value Date   HGBA1C 6.5 (H) 09/15/2022  -Previously declined to start on metformin for now.  Would like to change her diet and increase physical activity will recheck A1c if still high will start on medication. -Declined CBG check - Hemoglobin A1c  5. Nasal congestion Nasal congestion with swollen bilateral turbinates -Saline nasal spray rinse and Zyrtec samples given during visit - fluticasone (FLONASE) 50 MCG/ACT nasal spray; Place 2  sprays into both nostrils  daily.  Dispense: 16 g; Refill: 6 -Advised to notify provider if symptoms worsen or not resolved  Family/ staff Communication: Reviewed plan of care with patient verbalized understanding  Labs/tests ordered:  - Lipid panel - TSH - COMPLETE METABOLIC PANEL WITH GFR - CBC with Differential/Platelet - Hemoglobin A1c  Next Appointment : Return in about 6 months (around 04/04/2024) for medical mangement of chronic issues.Caesar Bookman, NP

## 2023-10-06 LAB — CBC WITH DIFFERENTIAL/PLATELET
Absolute Lymphocytes: 1830 {cells}/uL (ref 850–3900)
Absolute Monocytes: 390 {cells}/uL (ref 200–950)
Basophils Absolute: 49 {cells}/uL (ref 0–200)
Basophils Relative: 0.8 %
Eosinophils Absolute: 98 {cells}/uL (ref 15–500)
Eosinophils Relative: 1.6 %
HCT: 38.5 % (ref 35.0–45.0)
Hemoglobin: 13.1 g/dL (ref 11.7–15.5)
MCH: 30.3 pg (ref 27.0–33.0)
MCHC: 34 g/dL (ref 32.0–36.0)
MCV: 88.9 fL (ref 80.0–100.0)
MPV: 10.4 fL (ref 7.5–12.5)
Monocytes Relative: 6.4 %
Neutro Abs: 3733 {cells}/uL (ref 1500–7800)
Neutrophils Relative %: 61.2 %
Platelets: 374 10*3/uL (ref 140–400)
RBC: 4.33 10*6/uL (ref 3.80–5.10)
RDW: 13 % (ref 11.0–15.0)
Total Lymphocyte: 30 %
WBC: 6.1 10*3/uL (ref 3.8–10.8)

## 2023-10-06 LAB — COMPLETE METABOLIC PANEL WITH GFR
AG Ratio: 1.5 (calc) (ref 1.0–2.5)
ALT: 11 U/L (ref 6–29)
AST: 16 U/L (ref 10–35)
Albumin: 4.7 g/dL (ref 3.6–5.1)
Alkaline phosphatase (APISO): 63 U/L (ref 37–153)
BUN: 10 mg/dL (ref 7–25)
CO2: 31 mmol/L (ref 20–32)
Calcium: 10.4 mg/dL (ref 8.6–10.4)
Chloride: 100 mmol/L (ref 98–110)
Creat: 0.89 mg/dL (ref 0.50–1.03)
Globulin: 3.2 g/dL (ref 1.9–3.7)
Glucose, Bld: 105 mg/dL — ABNORMAL HIGH (ref 65–99)
Potassium: 3.7 mmol/L (ref 3.5–5.3)
Sodium: 139 mmol/L (ref 135–146)
Total Bilirubin: 0.9 mg/dL (ref 0.2–1.2)
Total Protein: 7.9 g/dL (ref 6.1–8.1)
eGFR: 75 mL/min/{1.73_m2} (ref >=60)

## 2023-10-06 LAB — LIPID PANEL
Cholesterol: 255 mg/dL — ABNORMAL HIGH (ref <200)
HDL: 76 mg/dL (ref >=50)
LDL Cholesterol (Calc): 160 mg/dL — ABNORMAL HIGH
Non-HDL Cholesterol (Calc): 179 mg/dL — ABNORMAL HIGH (ref <130)
Total CHOL/HDL Ratio: 3.4 (calc) (ref <5.0)
Triglycerides: 83 mg/dL (ref <150)

## 2023-10-06 LAB — HEMOGLOBIN A1C
Hgb A1c MFr Bld: 6.6 %{Hb} — ABNORMAL HIGH (ref <5.7)
Mean Plasma Glucose: 143 mg/dL
eAG (mmol/L): 7.9 mmol/L

## 2023-10-06 LAB — TSH: TSH: 0.76 m[IU]/L (ref 0.40–4.50)

## 2023-10-11 ENCOUNTER — Encounter: Payer: 59 | Admitting: Family

## 2023-10-12 ENCOUNTER — Other Ambulatory Visit (HOSPITAL_COMMUNITY): Payer: Self-pay

## 2023-10-12 ENCOUNTER — Encounter: Payer: Self-pay | Admitting: Family

## 2023-10-12 ENCOUNTER — Telehealth (INDEPENDENT_AMBULATORY_CARE_PROVIDER_SITE_OTHER): Payer: 59 | Admitting: Family

## 2023-10-12 ENCOUNTER — Other Ambulatory Visit: Payer: Self-pay

## 2023-10-12 DIAGNOSIS — I10 Essential (primary) hypertension: Secondary | ICD-10-CM

## 2023-10-12 DIAGNOSIS — E785 Hyperlipidemia, unspecified: Secondary | ICD-10-CM

## 2023-10-12 DIAGNOSIS — E119 Type 2 diabetes mellitus without complications: Secondary | ICD-10-CM | POA: Diagnosis not present

## 2023-10-12 MED ORDER — ATORVASTATIN CALCIUM 10 MG PO TABS
10.0000 mg | ORAL_TABLET | Freq: Every day | ORAL | 3 refills | Status: DC
  Filled 2023-10-12 (×3): qty 30, 30d supply, fill #0
  Filled 2023-10-12: qty 90, 90d supply, fill #0
  Filled 2023-10-12 – 2023-10-13 (×3): qty 30, 30d supply, fill #0
  Filled 2023-11-12 (×2): qty 30, 30d supply, fill #1
  Filled 2023-12-08 (×2): qty 30, 30d supply, fill #2
  Filled 2024-01-11: qty 30, 30d supply, fill #3
  Filled 2024-02-07: qty 30, 30d supply, fill #0
  Filled 2024-02-07: qty 30, 30d supply, fill #4
  Filled 2024-03-08: qty 30, 30d supply, fill #0
  Filled 2024-03-08 – 2024-04-05 (×2): qty 30, 30d supply, fill #1
  Filled 2024-05-16: qty 30, 30d supply, fill #2
  Filled 2024-06-14 (×2): qty 30, 30d supply, fill #3
  Filled 2024-07-11: qty 30, 30d supply, fill #4
  Filled 2024-08-10: qty 30, 30d supply, fill #5
  Filled 2024-08-22 – 2024-09-13 (×2): qty 30, 30d supply, fill #6

## 2023-10-12 MED ORDER — LOSARTAN POTASSIUM 25 MG PO TABS
25.0000 mg | ORAL_TABLET | Freq: Every day | ORAL | 1 refills | Status: DC
  Filled 2023-10-12 (×4): qty 30, 30d supply, fill #0
  Filled 2023-10-12: qty 90, 90d supply, fill #0
  Filled 2023-10-13 (×2): qty 30, 30d supply, fill #0
  Filled 2023-11-12 (×2): qty 30, 30d supply, fill #1
  Filled 2023-12-08 (×2): qty 30, 30d supply, fill #2
  Filled 2024-01-11: qty 30, 30d supply, fill #3
  Filled 2024-02-07: qty 30, 30d supply, fill #4
  Filled 2024-02-07 – 2024-03-08 (×2): qty 30, 30d supply, fill #0
  Filled 2024-03-08: qty 30, 30d supply, fill #1

## 2023-10-12 MED ORDER — ASPIRIN 81 MG PO TBEC: 81.0000 mg | DELAYED_RELEASE_TABLET | Freq: Every day | ORAL | Status: AC

## 2023-10-12 MED ORDER — METFORMIN HCL 500 MG PO TABS
500.0000 mg | ORAL_TABLET | Freq: Every day | ORAL | 1 refills | Status: DC
  Filled 2023-10-12 (×2): qty 30, 30d supply, fill #0
  Filled 2023-10-12: qty 90, 90d supply, fill #0
  Filled 2023-10-12 – 2023-10-13 (×4): qty 30, 30d supply, fill #0
  Filled 2023-11-12 (×2): qty 30, 30d supply, fill #1
  Filled 2023-12-08 (×2): qty 30, 30d supply, fill #2
  Filled 2024-01-11: qty 30, 30d supply, fill #3
  Filled 2024-02-07: qty 30, 30d supply, fill #0
  Filled 2024-06-14 (×2): qty 30, 30d supply, fill #1

## 2023-10-12 MED ORDER — LANCET DEVICE MISC
1.0000 | Freq: Three times a day (TID) | 0 refills | Status: AC
  Filled 2023-10-12: qty 1, 30d supply, fill #0

## 2023-10-12 MED ORDER — BLOOD GLUCOSE TEST VI STRP
1.0000 | ORAL_STRIP | Freq: Three times a day (TID) | 3 refills | Status: AC
  Filled 2023-10-12: qty 100, 34d supply, fill #0
  Filled 2023-10-12 – 2023-10-13 (×2): qty 100, 30d supply, fill #0
  Filled 2023-10-13: qty 50, 17d supply, fill #0
  Filled 2023-10-13: qty 100, 30d supply, fill #0

## 2023-10-12 MED ORDER — ACCU-CHEK SOFTCLIX LANCETS MISC
1.0000 | Freq: Three times a day (TID) | 3 refills | Status: AC
  Filled 2023-10-12 – 2023-10-13 (×4): qty 100, 30d supply, fill #0

## 2023-10-12 MED ORDER — BLOOD GLUCOSE MONITOR SYSTEM W/DEVICE KIT
1.0000 | PACK | Freq: Three times a day (TID) | 0 refills | Status: AC
  Filled 2023-10-12 – 2023-10-13 (×4): qty 1, 30d supply, fill #0

## 2023-10-12 NOTE — Patient Instructions (Signed)
-   check blood sugar daily and record on log.Bring log in 2 weeks to evaluate  - Notify provider for blood sugars less than 75 or greater than 200  -  check Blood pressure at home and record  and notify provider if B/p > 140/90.Upload readings on the Mychart for provider to review  - Recommend a low saturated fats, low carbohydrates and high vegetable diet also exercise at least 3 times per week for 30 minutes.

## 2023-10-12 NOTE — Progress Notes (Unsigned)
This service is provided via telemedicine  No vital signs collected/recorded due to the encounter was a telemedicine visit.   Location of patient (ex: home, work):  Home  Patient consents to a telephone visit:    Location of the provider (ex: office, home):  Office  Name of any referring provider:  Charli Halle,FNP-C  Names of all persons participating in the telemedicine service and their role in the encounter:  Lonia Katrinka Blazing; Judithann Sauger McClurkin,CMA; Taela Charbonneau,NP  Time spent on call:  5 minutes   Provider: Lerae Langham FNP-C  Anayansi Rundquist, Donalee Citrin, NP  Patient Care Team: Klaus Casteneda, Donalee Citrin, NP as PCP - General (Family Medicine)  Extended Emergency Contact Information Primary Emergency Contact: Perlmutter,Lemar Address: [address] [place]  [place] Phone: [phone] Relation: Son Secondary Emergency Contact: Nelson,Denise Mobile Phone: [phone] Relation: Friend  Code Status:  Full Code  Goals of care: Advanced Directive information    08/04/2023    1:47 PM  Advanced Directives  Does Patient Have a Medical Advance Directive? No  Would patient like information on creating a medical advance directive? No - Patient declined     Chief Complaint  Patient presents with   Acute Visit    Patient presents today for MyChart visit to discuss treatment for diabetes and cholesterol.     HPI:  Pt is a 57 y.o. female seen today for an acute visit to discuss lab results. Her recent lab results reviewed and discussed during visit.All labs were normal except the following:   Hemoglobin A1c has worsened from 6.5 to 6.6 on 10/05/2023 Total cholesterol 255 previous to 249; triglycerides normal 8 3; LDL cholesterol 160 previous 154.  States does not eat any foods that are high in fats such as bacon, cheese or pork.  States usually removes the skin whenever she eats chicken.  Has been using an air frier instead of deep frying. States was a cholesterol medication in the past  and levels went down and was taken off medication.  She denies any chest tightness or chest pain.Not on Asprin.states allergic to ibuprofen but can take ASA.Has been exercising by going up and down the stairs in the house.  B/p has been running high despite taking Hydrochlorothiazide and clonidine.states took extra clonidine today.   Past Medical History:  Diagnosis Date   Carpal tunnel syndrome on both sides    Chronic foot pain, left    History of vertigo    Hyperglycemia 01/3020   Hypertension    Prediabetes 01/2020   Past Surgical History:  Procedure Laterality Date   EYE SURGERY      Allergies  Allergen Reactions   Ibuprofen Shortness Of Breath    Outpatient Encounter Medications as of 10/12/2023  Medication Sig   cloNIDine (CATAPRES) 0.1 MG tablet Take 1 tablet (0.1 mg total) by mouth 2 (two) times daily.   fluticasone (FLONASE) 50 MCG/ACT nasal spray Place 2 sprays into both nostrils daily.   hydrochlorothiazide (HYDRODIURIL) 25 MG tablet Take 1 tablet (25 mg total) by mouth daily.   meclizine (ANTIVERT) 25 MG tablet Take 1 tablet (25 mg total) by mouth 3 (three) times daily as needed for dizziness.   meloxicam (MOBIC) 15 MG tablet Take 1 tablet (15 mg total) by mouth daily.   methocarbamol (ROBAXIN) 500 MG tablet Take 1 tablet (500 mg total) by mouth at bedtime as needed.   omeprazole (PRILOSEC) 40 MG capsule Take 1 capsule (40 mg total) by mouth daily.   No facility-administered encounter  medications on file as of 10/12/2023.    Review of Systems  Constitutional:  Negative for appetite change, chills, fatigue, fever and unexpected weight change.  HENT:  Negative for congestion, dental problem, ear discharge, ear pain, facial swelling, hearing loss, nosebleeds, postnasal drip, rhinorrhea, sinus pressure, sinus pain, sneezing, sore throat, tinnitus and trouble swallowing.   Eyes:  Negative for pain, discharge, redness, itching and visual disturbance.  Respiratory:   Negative for cough, chest tightness, shortness of breath and wheezing.   Cardiovascular:  Negative for chest pain, palpitations and leg swelling.  Gastrointestinal:  Negative for abdominal distention, abdominal pain, blood in stool, constipation, diarrhea, nausea and vomiting.  Endocrine: Negative for cold intolerance, heat intolerance, polydipsia, polyphagia and polyuria.  Genitourinary:  Negative for difficulty urinating, dysuria, flank pain, frequency and urgency.  Musculoskeletal:  Negative for arthralgias, back pain, gait problem, joint swelling, myalgias, neck pain and neck stiffness.  Skin:  Negative for color change, pallor, rash and wound.  Neurological:  Negative for dizziness, syncope, speech difficulty, weakness, light-headedness, numbness and headaches.  Hematological:  Does not bruise/bleed easily.  Psychiatric/Behavioral:  Negative for agitation, behavioral problems, confusion, hallucinations, self-injury, sleep disturbance and suicidal ideas. The patient is not nervous/anxious.     Immunization History  Administered Date(s) Administered   PFIZER(Purple Top)SARS-COV-2 Vaccination 01/22/2020, 02/12/2020   Tdap 05/18/2016   Pertinent  Health Maintenance Due  Topic Date Due   Colonoscopy  Never done   MAMMOGRAM  11/09/2024      08/13/2022    8:13 AM 09/07/2022    2:20 PM 07/15/2023   11:25 AM 08/04/2023    1:46 PM 10/05/2023    1:23 PM  Fall Risk  Falls in the past year? 0 0 0 0 0  Was there an injury with Fall? 0 0  0 0  Fall Risk Category Calculator 0 0  0 0  Fall Risk Category (Retired) Low Low     (RETIRED) Patient Fall Risk Level Low fall risk Low fall risk     Patient at Risk for Falls Due to  No Fall Risks  No Fall Risks No Fall Risks  Fall risk Follow up Falls evaluation completed Falls evaluation completed  Falls evaluation completed Falls evaluation completed   Functional Status Survey:    There were no vitals filed for this visit. There is no height or  weight on file to calculate BMI. Physical Exam Constitutional:      General: She is not in acute distress.    Appearance: She is not ill-appearing.  Pulmonary:     Effort: Pulmonary effort is normal. No respiratory distress.  Neurological:     Mental Status: She is alert and oriented to person, place, and time.     Gait: Gait normal.  Psychiatric:        Mood and Affect: Mood normal.        Behavior: Behavior normal.    Labs reviewed: Recent Labs    10/05/23 1415  NA 139  K 3.7  CL 100  CO2 31  GLUCOSE 105*  BUN 10  CREATININE 0.89  CALCIUM 10.4   Recent Labs    10/05/23 1415  AST 16  ALT 11  BILITOT 0.9  PROT 7.9   Recent Labs    10/05/23 1415  WBC 6.1  NEUTROABS 3,733  HGB 13.1  HCT 38.5  MCV 88.9  PLT 374   Lab Results  Component Value Date   TSH 0.76 10/05/2023   Lab Results  Component Value Date   HGBA1C 6.6 (H) 10/05/2023   Lab Results  Component Value Date   CHOL 255 (H) 10/05/2023   HDL 76 10/05/2023   LDLCALC 160 (H) 10/05/2023   TRIG 83 10/05/2023   CHOLHDL 3.4 10/05/2023    Significant Diagnostic Results in last 30 days:  No results found.  Assessment/Plan  1. Type 2 diabetes mellitus without complication, without long-term current use of insulin (HCC) Lab Results  Component Value Date   HGBA1C 6.6 (H) 10/05/2023  A1C has worsen compared to previous. -Start on metformin 500 mg tablet daily.  Side effects discussed. -Dietary modification and exercise at least 30 minutes 3 times per week -Start on losartan for blood pressure and renal protection -Also advised to take aspirin and atorvastatin for cardiovascular event prevention -Check blood sugar and record on log daily then follow-up in 2 weeks to evaluate blood sugars. -Advised to notify provider if blood sugars are less than 75 or greater than 200.  To drink orange juice if blood sugars are low or having symptoms of hypoglycemia. - losartan (COZAAR) 25 MG tablet; Take 1 tablet  (25 mg total) by mouth daily.  Dispense: 90 tablet; Refill: 1 - metFORMIN (GLUCOPHAGE) 500 MG tablet; Take 1 tablet (500 mg total) by mouth daily with breakfast.  Dispense: 90 tablet; Refill: 1 - atorvastatin (LIPITOR) 10 MG tablet; Take 1 tablet (10 mg total) by mouth daily.  Dispense: 90 tablet; Refill: 3 - aspirin EC 81 MG tablet; Take 1 tablet (81 mg total) by mouth daily. Swallow whole. - Blood Glucose Monitoring Suppl (BLOOD GLUCOSE MONITOR SYSTEM) w/Device KIT; Testing in the morning, at noon, and at bedtime.  Dispense: 1 kit; Refill: 0 - Glucose Blood (BLOOD GLUCOSE TEST STRIPS) STRP; Testing in the morning, at noon, and at bedtime.  Dispense: 100 strip; Refill: 3 - Lancet Device MISC; 1 each by Does not apply route in the morning, at noon, and at bedtime.  Dispense: 1 each; Refill: 0 - Accu-Chek Softclix Lancets lancets; Testing in the morning, at noon, and at bedtime.  Dispense: 100 each; Refill: 3 - check urine microalbumin in 2 weeks  - Lipid panel; Future - TSH; Future - COMPLETE METABOLIC PANEL WITH GFR; Future - CBC with Differential/Platelet; Future - Hemoglobin A1c; Future  2. Hyperlipidemia LDL goal <70 LDL 150 -Dietary modification and exercise advised -Start on atorvastatin.  Side effects discussed - atorvastatin (LIPITOR) 10 MG tablet; Take 1 tablet (10 mg total) by mouth daily.  Dispense: 90 tablet; Refill: 3 - Lipid panel; Future  3. Essential hypertension Reports blood pressure is high at home. -Will add losartan 25 mg as below.  Side effects discussed -Dietary modification and exercise as above -Start on aspirin and statin for cardiovascular event prevention -  Advised to check Blood pressure at home and record on log provided and notify provider if B/p > 140/90  - losartan (COZAAR) 25 MG tablet; Take 1 tablet (25 mg total) by mouth daily.  Dispense: 90 tablet; Refill: 1 - aspirin EC 81 MG tablet; Take 1 tablet (81 mg total) by mouth daily. Swallow whole. -  Lipid panel; Future - TSH; Future - COMPLETE METABOLIC PANEL WITH GFR; Future - CBC with Differential/Platelet; Future  Family/ staff Communication: Reviewed plan of care with patient verbalized understanding  Labs/tests ordered:  - Lipid panel; Future - TSH; Future - COMPLETE METABOLIC PANEL WITH GFR; Future - CBC with Differential/Platelet; Future - Hemoglobin A1c; Future  Next Appointment: Return in about 4  months (around 02/10/2024) for Fasting labs. 2 weeks for blood pressure and type 2 DM .  I connected with  Micaela Airington on 10/12/23 by a video enabled telemedicine application and verified that I am speaking with the correct person using two identifiers.   I discussed the limitations of evaluation and management by telemedicine. The patient expressed understanding and agreed to proceed.   Spent 21 minutes of face to face with patient  >50% time spent counseling; reviewing medical record; tests; labs; and developing future plan of care.   Caesar Bookman, NP

## 2023-10-13 ENCOUNTER — Other Ambulatory Visit: Payer: Self-pay

## 2023-10-13 ENCOUNTER — Other Ambulatory Visit (HOSPITAL_COMMUNITY): Payer: Self-pay

## 2023-10-14 ENCOUNTER — Other Ambulatory Visit: Payer: Self-pay

## 2023-10-18 NOTE — Progress Notes (Signed)
This encounter was created in error - please disregard.

## 2023-10-19 ENCOUNTER — Other Ambulatory Visit: Payer: Self-pay

## 2023-10-19 ENCOUNTER — Other Ambulatory Visit: Payer: Self-pay | Admitting: Family

## 2023-10-19 DIAGNOSIS — I10 Essential (primary) hypertension: Secondary | ICD-10-CM

## 2023-10-20 ENCOUNTER — Other Ambulatory Visit: Payer: Self-pay

## 2023-10-20 ENCOUNTER — Other Ambulatory Visit (HOSPITAL_COMMUNITY): Payer: Self-pay

## 2023-10-20 MED ORDER — CLONIDINE HCL 0.1 MG PO TABS
0.1000 mg | ORAL_TABLET | Freq: Two times a day (BID) | ORAL | 1 refills | Status: DC
  Filled 2023-10-20: qty 60, 30d supply, fill #0
  Filled 2023-10-20: qty 180, 90d supply, fill #0
  Filled 2023-11-12 – 2023-12-08 (×4): qty 60, 30d supply, fill #1
  Filled 2024-01-11: qty 60, 30d supply, fill #0
  Filled 2024-01-11: qty 60, 30d supply, fill #2
  Filled 2024-02-07: qty 60, 30d supply, fill #0
  Filled 2024-03-08: qty 60, 30d supply, fill #1
  Filled 2024-03-08: qty 60, 30d supply, fill #0
  Filled 2024-04-05: qty 60, 30d supply, fill #1

## 2023-10-25 ENCOUNTER — Encounter: Payer: 59 | Admitting: Family

## 2023-10-26 NOTE — Progress Notes (Signed)
  This encounter was created in error - please disregard. No show 

## 2023-11-12 ENCOUNTER — Other Ambulatory Visit: Payer: Self-pay

## 2023-12-08 ENCOUNTER — Other Ambulatory Visit (HOSPITAL_COMMUNITY): Payer: Self-pay

## 2023-12-08 ENCOUNTER — Other Ambulatory Visit: Payer: Self-pay

## 2023-12-10 ENCOUNTER — Other Ambulatory Visit (HOSPITAL_COMMUNITY): Payer: Self-pay

## 2023-12-10 ENCOUNTER — Other Ambulatory Visit: Payer: Self-pay

## 2024-01-11 ENCOUNTER — Other Ambulatory Visit: Payer: Self-pay | Admitting: Family

## 2024-01-11 ENCOUNTER — Other Ambulatory Visit: Payer: Self-pay

## 2024-01-11 DIAGNOSIS — I10 Essential (primary) hypertension: Secondary | ICD-10-CM

## 2024-01-11 MED ORDER — HYDROCHLOROTHIAZIDE 25 MG PO TABS
25.0000 mg | ORAL_TABLET | Freq: Every day | ORAL | 1 refills | Status: DC
  Filled 2024-01-11 – 2024-02-07 (×2): qty 30, 30d supply, fill #0
  Filled 2024-02-07: qty 30, 30d supply, fill #1
  Filled 2024-03-08: qty 30, 30d supply, fill #0
  Filled 2024-03-08 – 2024-04-05 (×2): qty 30, 30d supply, fill #1
  Filled 2024-05-16: qty 30, 30d supply, fill #2
  Filled 2024-06-14 (×2): qty 30, 30d supply, fill #3

## 2024-01-12 ENCOUNTER — Other Ambulatory Visit: Payer: Self-pay

## 2024-02-07 ENCOUNTER — Other Ambulatory Visit: Payer: Self-pay

## 2024-02-07 ENCOUNTER — Other Ambulatory Visit (HOSPITAL_COMMUNITY): Payer: Self-pay

## 2024-02-08 ENCOUNTER — Other Ambulatory Visit: Payer: Self-pay

## 2024-02-16 ENCOUNTER — Other Ambulatory Visit: Payer: 59

## 2024-03-08 ENCOUNTER — Other Ambulatory Visit: Payer: Self-pay

## 2024-03-08 ENCOUNTER — Other Ambulatory Visit (HOSPITAL_COMMUNITY): Payer: Self-pay

## 2024-03-10 ENCOUNTER — Other Ambulatory Visit: Payer: Self-pay

## 2024-04-05 ENCOUNTER — Other Ambulatory Visit: Payer: Self-pay

## 2024-04-05 ENCOUNTER — Other Ambulatory Visit: Payer: Self-pay | Admitting: Family

## 2024-04-05 DIAGNOSIS — I10 Essential (primary) hypertension: Secondary | ICD-10-CM

## 2024-04-05 DIAGNOSIS — E119 Type 2 diabetes mellitus without complications: Secondary | ICD-10-CM

## 2024-04-05 MED ORDER — LOSARTAN POTASSIUM 25 MG PO TABS
25.0000 mg | ORAL_TABLET | Freq: Every day | ORAL | 1 refills | Status: DC
  Filled 2024-04-05: qty 90, 90d supply, fill #0
  Filled 2024-07-11: qty 90, 90d supply, fill #1

## 2024-04-07 ENCOUNTER — Other Ambulatory Visit: Payer: Self-pay

## 2024-04-07 ENCOUNTER — Encounter: Payer: 59 | Admitting: Family

## 2024-04-09 NOTE — Progress Notes (Signed)
   This encounter was created in error - please disregard. No show

## 2024-04-10 ENCOUNTER — Other Ambulatory Visit: Payer: Self-pay

## 2024-05-16 ENCOUNTER — Other Ambulatory Visit: Payer: Self-pay

## 2024-05-17 ENCOUNTER — Other Ambulatory Visit: Payer: Self-pay

## 2024-05-18 ENCOUNTER — Other Ambulatory Visit: Payer: Self-pay

## 2024-05-29 ENCOUNTER — Encounter: Admitting: Family

## 2024-05-31 ENCOUNTER — Encounter: Admitting: Family

## 2024-06-14 ENCOUNTER — Other Ambulatory Visit: Payer: Self-pay

## 2024-06-14 ENCOUNTER — Other Ambulatory Visit: Payer: Self-pay | Admitting: Family

## 2024-06-14 DIAGNOSIS — I10 Essential (primary) hypertension: Secondary | ICD-10-CM

## 2024-06-14 MED ORDER — CLONIDINE HCL 0.1 MG PO TABS
0.1000 mg | ORAL_TABLET | Freq: Two times a day (BID) | ORAL | 1 refills | Status: DC
  Filled 2024-06-14 (×2): qty 180, 90d supply, fill #0
  Filled 2024-09-13: qty 180, 90d supply, fill #1

## 2024-06-15 ENCOUNTER — Other Ambulatory Visit: Payer: Self-pay

## 2024-06-21 ENCOUNTER — Encounter: Payer: Self-pay | Admitting: Family

## 2024-06-21 ENCOUNTER — Other Ambulatory Visit: Payer: Self-pay

## 2024-06-21 ENCOUNTER — Ambulatory Visit: Admitting: Family

## 2024-06-21 VITALS — BP 128/80 | HR 80 | Temp 98.0°F | Resp 19 | Ht 64.0 in | Wt 152.0 lb

## 2024-06-21 DIAGNOSIS — R0981 Nasal congestion: Secondary | ICD-10-CM

## 2024-06-21 DIAGNOSIS — M25511 Pain in right shoulder: Secondary | ICD-10-CM

## 2024-06-21 DIAGNOSIS — Z0000 Encounter for general adult medical examination without abnormal findings: Secondary | ICD-10-CM | POA: Diagnosis not present

## 2024-06-21 DIAGNOSIS — E785 Hyperlipidemia, unspecified: Secondary | ICD-10-CM

## 2024-06-21 DIAGNOSIS — R1310 Dysphagia, unspecified: Secondary | ICD-10-CM

## 2024-06-21 DIAGNOSIS — E119 Type 2 diabetes mellitus without complications: Secondary | ICD-10-CM | POA: Diagnosis not present

## 2024-06-21 DIAGNOSIS — I10 Essential (primary) hypertension: Secondary | ICD-10-CM

## 2024-06-21 DIAGNOSIS — K219 Gastro-esophageal reflux disease without esophagitis: Secondary | ICD-10-CM

## 2024-06-21 MED ORDER — GLIMEPIRIDE 1 MG PO TABS
1.0000 mg | ORAL_TABLET | Freq: Every day | ORAL | 3 refills | Status: DC
  Filled 2024-06-21: qty 30, 30d supply, fill #0
  Filled 2024-07-11 – 2024-07-27 (×2): qty 30, 30d supply, fill #1
  Filled 2024-08-22: qty 30, 30d supply, fill #2
  Filled 2024-09-29: qty 30, 30d supply, fill #3

## 2024-06-21 MED ORDER — FLUTICASONE PROPIONATE 50 MCG/ACT NA SUSP
2.0000 | Freq: Every day | NASAL | 6 refills | Status: AC
  Filled 2024-06-21: qty 16, 30d supply, fill #0
  Filled 2024-08-22: qty 16, 30d supply, fill #1
  Filled 2024-09-13: qty 16, 30d supply, fill #2
  Filled 2024-11-24: qty 16, 30d supply, fill #3

## 2024-06-21 MED ORDER — METHOCARBAMOL 500 MG PO TABS
500.0000 mg | ORAL_TABLET | Freq: Every evening | ORAL | 1 refills | Status: DC | PRN
  Filled 2024-06-21: qty 30, 30d supply, fill #0
  Filled 2024-08-10: qty 30, 30d supply, fill #1

## 2024-06-22 LAB — COMPREHENSIVE METABOLIC PANEL WITH GFR
AG Ratio: 1.4 (calc) (ref 1.0–2.5)
ALT: 33 U/L — ABNORMAL HIGH (ref 6–29)
AST: 36 U/L — ABNORMAL HIGH (ref 10–35)
Albumin: 4.9 g/dL (ref 3.6–5.1)
Alkaline phosphatase (APISO): 74 U/L (ref 37–153)
BUN: 17 mg/dL (ref 7–25)
CO2: 34 mmol/L — ABNORMAL HIGH (ref 20–32)
Calcium: 10.2 mg/dL (ref 8.6–10.4)
Chloride: 96 mmol/L — ABNORMAL LOW (ref 98–110)
Creat: 0.77 mg/dL (ref 0.50–1.05)
Globulin: 3.6 g/dL (ref 1.9–3.7)
Glucose, Bld: 107 mg/dL — ABNORMAL HIGH (ref 65–99)
Potassium: 4 mmol/L (ref 3.5–5.3)
Sodium: 136 mmol/L (ref 135–146)
Total Bilirubin: 0.6 mg/dL (ref 0.2–1.2)
Total Protein: 8.5 g/dL — ABNORMAL HIGH (ref 6.1–8.1)
eGFR: 88 mL/min/1.73m2 (ref >=60)

## 2024-06-22 LAB — LIPID PANEL
Cholesterol: 201 mg/dL — ABNORMAL HIGH (ref <200)
HDL: 92 mg/dL (ref >=50)
LDL Cholesterol (Calc): 92 mg/dL
Non-HDL Cholesterol (Calc): 109 mg/dL (ref <130)
Total CHOL/HDL Ratio: 2.2 (calc) (ref <5.0)
Triglycerides: 84 mg/dL (ref <150)

## 2024-06-22 LAB — CBC WITH DIFFERENTIAL/PLATELET
Absolute Lymphocytes: 2059 {cells}/uL (ref 850–3900)
Absolute Monocytes: 442 {cells}/uL (ref 200–950)
Basophils Absolute: 42 {cells}/uL (ref 0–200)
Basophils Relative: 0.8 %
Eosinophils Absolute: 62 {cells}/uL (ref 15–500)
Eosinophils Relative: 1.2 %
HCT: 39.9 % (ref 35.0–45.0)
Hemoglobin: 13.2 g/dL (ref 11.7–15.5)
MCH: 30.7 pg (ref 27.0–33.0)
MCHC: 33.1 g/dL (ref 32.0–36.0)
MCV: 92.8 fL (ref 80.0–100.0)
MPV: 9.5 fL (ref 7.5–12.5)
Monocytes Relative: 8.5 %
Neutro Abs: 2595 {cells}/uL (ref 1500–7800)
Neutrophils Relative %: 49.9 %
Platelets: 393 Thousand/uL (ref 140–400)
RBC: 4.3 Million/uL (ref 3.80–5.10)
RDW: 13.3 % (ref 11.0–15.0)
Total Lymphocyte: 39.6 %
WBC: 5.2 Thousand/uL (ref 3.8–10.8)

## 2024-06-22 LAB — HEMOGLOBIN A1C
Hgb A1c MFr Bld: 6.5 % — ABNORMAL HIGH (ref <5.7)
Mean Plasma Glucose: 140 mg/dL
eAG (mmol/L): 7.7 mmol/L

## 2024-06-22 LAB — MICROALBUMIN / CREATININE URINE RATIO
Creatinine, Urine: 152 mg/dL (ref 20–275)
Microalb Creat Ratio: 4 mg/g{creat} (ref <30)
Microalb, Ur: 0.6 mg/dL

## 2024-06-22 LAB — TSH: TSH: 1.33 m[IU]/L (ref 0.40–4.50)

## 2024-06-23 ENCOUNTER — Other Ambulatory Visit: Payer: Self-pay

## 2024-06-28 NOTE — Progress Notes (Signed)
 Provider: Roxan Plough FNP-C   Alija Riano, Roxan BROCKS, NP  Patient Care Team: Erielle Gawronski, Roxan BROCKS, NP as PCP - General (Family Medicine)  Extended Emergency Contact Information Primary Emergency Contact: Staubs,Lemar Address: [address] [place]   [place] Mobile Phone: [phone] Relation: Son Secondary Emergency Contact: Nelson,Denise Mobile Phone: [phone] Relation: Friend  Code Status:  Full Code  Goals of care: Advanced Directive information    06/21/2024    1:35 PM  Advanced Directives  Does Patient Have a Medical Advance Directive? No  Would patient like information on creating a medical advance directive? No - Patient declined     Chief Complaint  Patient presents with   Annual Exam    Physical      Discussed the use of AI scribe software for clinical note transcription with the patient, who gave verbal consent to proceed.  History of Present Illness   Nina Clark is a 58 year old female with diabetes and hypertension who presents for a follow-up visit and management of medication side effects.  She reports significant gastrointestinal distress, including vomiting 25 out of 30 days and a sensation of food being stuck in her throat, particularly at night, which she associates with taking metformin . This sensation is described as 'choking,' with food seeming to come back up after swallowing. She reports that despite previous use of omeprazole , her acid reflux is 'back worse than ever,' and she has been taking over-the-counter medication for relief.  She is on multiple medications for hypertension, including hydrochlorothiazide , losartan , and clonidine , which she feels cause excessive drowsiness. She also takes atorvastatin  for cholesterol, aspirin , and meloxicam  for pain. She has reduced her metformin  intake to once a week due to adverse effects and does not check her blood sugar regularly.  Her diet consists of eating once a day with snacks throughout the  night, primarily fruits like grapes and tangerines, but also sweets. She acknowledges that her diet may contribute to her blood sugar issues but is reluctant to change her eating habits.  She experiences significant shoulder pain from a previous injury, which she reports was diagnosed as a broken bone and continues to cause her persistent tenderness. She has been denied an MRI by Medicaid due to previous lack of coverage.  She describes a recent severe illness lasting ten days, characterized by body aches and fever, which she attributes to environmental factors in her neighborhood. She reports seeing trucks emitting gas and believes this has affected her health.  She has not had recent blood work due to transportation issues and has not been checking her blood pressure at home due to a broken machine. She reports a knot on her foot that bothers her when wearing shoes.  She lives alone and has been trying to secure income for her household. She reports significant issues with transportation, which have affected her ability to attend medical appointments and complete necessary tests.  No blood in stool or issues with numbness or tingling in her hands. She reports severe nasal congestion and uses Flonase  and Zyrtec daily for allergies. No recent eye examination.   Past Medical History:  Diagnosis Date   Carpal tunnel syndrome on both sides    Chronic foot pain, left    History of vertigo    Hyperglycemia 01/3020   Hypertension    Prediabetes 01/2020   Past Surgical History:  Procedure Laterality Date   EYE SURGERY      Allergies  Allergen Reactions   Ibuprofen  Shortness Of Breath  Allergies as of 06/21/2024       Reactions   Ibuprofen  Shortness Of Breath        Medication List        Accurate as of June 21, 2024 11:59 PM. If you have any questions, ask your nurse or doctor.          STOP taking these medications    metFORMIN  500 MG tablet Commonly known as:  GLUCOPHAGE  Stopped by: Diany Formosa C Mayola Mcbain       TAKE these medications    Accu-Chek Guide Me w/Device Kit Testing in the morning, at noon, and at bedtime.   Accu-Chek Guide Test test strip Generic drug: glucose blood Testing in the morning, at noon, and at bedtime.   aspirin  EC 81 MG tablet Take 1 tablet (81 mg total) by mouth daily. Swallow whole.   atorvastatin  10 MG tablet Commonly known as: LIPITOR Take 1 tablet (10 mg total) by mouth daily.   cloNIDine  0.1 MG tablet Commonly known as: CATAPRES  Take 1 tablet (0.1 mg total) by mouth 2 (two) times daily.PATIENT NEEDS APPOINTMENT   fluticasone  50 MCG/ACT nasal spray Commonly known as: FLONASE  Place 2 sprays into both nostrils daily.   glimepiride  1 MG tablet Commonly known as: AMARYL  Take 1 tablet (1 mg total) by mouth daily with breakfast. Started by: Kashari Chalmers C Ebba Goll   hydrochlorothiazide  25 MG tablet Commonly known as: HYDRODIURIL  Take 1 tablet (25 mg total) by mouth daily.   losartan  25 MG tablet Commonly known as: COZAAR  Take 1 tablet (25 mg total) by mouth daily.   meclizine  25 MG tablet Commonly known as: ANTIVERT  Take 1 tablet (25 mg total) by mouth 3 (three) times daily as needed for dizziness.   meloxicam  15 MG tablet Commonly known as: MOBIC  Take 1 tablet (15 mg total) by mouth daily.   methocarbamol  500 MG tablet Commonly known as: ROBAXIN  Take 1 tablet (500 mg total) by mouth at bedtime as needed.   omeprazole  40 MG capsule Commonly known as: PRILOSEC Take 1 capsule (40 mg total) by mouth daily.        Review of Systems  Constitutional:  Negative for appetite change, chills, fatigue, fever and unexpected weight change.  HENT:  Positive for congestion and trouble swallowing. Negative for dental problem, ear discharge, ear pain, facial swelling, hearing loss, nosebleeds, postnasal drip, rhinorrhea, sinus pressure, sinus pain, sneezing, sore throat and tinnitus.        Flonase  effective  Eyes:   Negative for pain, discharge, redness, itching and visual disturbance.  Respiratory:  Negative for cough, chest tightness, shortness of breath and wheezing.   Cardiovascular:  Negative for chest pain, palpitations and leg swelling.  Gastrointestinal:  Positive for nausea and vomiting. Negative for abdominal distention, abdominal pain, blood in stool, constipation and diarrhea.  Endocrine: Negative for cold intolerance, heat intolerance, polydipsia, polyphagia and polyuria.  Genitourinary:  Negative for difficulty urinating, dysuria, flank pain, frequency and urgency.  Musculoskeletal:  Negative for arthralgias, back pain, gait problem, joint swelling, myalgias, neck pain and neck stiffness.  Skin:  Negative for color change, pallor, rash and wound.  Neurological:  Negative for dizziness, syncope, speech difficulty, weakness, light-headedness, numbness and headaches.  Hematological:  Does not bruise/bleed easily.  Psychiatric/Behavioral:  Negative for agitation, behavioral problems, confusion, hallucinations, self-injury, sleep disturbance and suicidal ideas. The patient is not nervous/anxious.     Immunization History  Administered Date(s) Administered   PFIZER(Purple Top)SARS-COV-2 Vaccination 01/22/2020, 02/12/2020   Tdap 05/18/2016  Pertinent  Health Maintenance Due  Topic Date Due   OPHTHALMOLOGY EXAM  10/21/2024 (Originally 02/20/2024)   MAMMOGRAM  11/09/2024   HEMOGLOBIN A1C  12/22/2024   FOOT EXAM  06/21/2025   Colonoscopy  06/21/2032      09/07/2022    2:20 PM 07/15/2023   11:25 AM 08/04/2023    1:46 PM 10/05/2023    1:23 PM 06/21/2024    1:33 PM  Fall Risk  Falls in the past year? 0 0 0 0 0  Was there an injury with Fall? 0  0 0 0  Fall Risk Category Calculator 0  0 0 0  Fall Risk Category (Retired) Low       (RETIRED) Patient Fall Risk Level Low fall risk       Patient at Risk for Falls Due to No Fall Risks  No Fall Risks No Fall Risks No Fall Risks  Fall risk Follow up  Falls evaluation completed   Falls evaluation completed Falls evaluation completed Falls evaluation completed     Data saved with a previous flowsheet row definition   Functional Status Survey:    Vitals:   06/21/24 1338  BP: 128/80  Pulse: 80  Resp: 19  Temp: 98 F (36.7 C)  SpO2: 99%  Weight: 152 lb (68.9 kg)  Height: 5' 4 (1.626 m)   Body mass index is 26.09 kg/m. Physical Exam   GENERAL: Alert, cooperative, well developed, no acute distress. HEENT: Normocephalic, normal oropharynx, moist mucous membranes, tympanic membranes normal bilaterally, nasal mucosa swollen, no sinus tenderness. NECK: Neck normal. CHEST: Clear to auscultation bilaterally, no wheezes, rhonchi, or crackles. CARDIOVASCULAR: Normal heart rate and rhythm, S1 and S2 normal without murmurs. ABDOMEN: Soft, non-tender, non-distended, without organomegaly, normal bowel sounds. EXTREMITIES: No cyanosis or edema. NEUROLOGICAL: Cranial nerves grossly intact, moves all extremities without gross motor or sensory deficit, sensation intact bilaterally.    Labs reviewed: Recent Labs    10/05/23 1415 06/21/24 1423  NA 139 136  K 3.7 4.0  CL 100 96*  CO2 31 34*  GLUCOSE 105* 107*  BUN 10 17  CREATININE 0.89 0.77  CALCIUM  10.4 10.2   Recent Labs    10/05/23 1415 06/21/24 1423  AST 16 36*  ALT 11 33*  BILITOT 0.9 0.6  PROT 7.9 8.5*   Recent Labs    10/05/23 1415 06/21/24 1423  WBC 6.1 5.2  NEUTROABS 3,733 2,595  HGB 13.1 13.2  HCT 38.5 39.9  MCV 88.9 92.8  PLT 374 393   Lab Results  Component Value Date   TSH 1.33 06/21/2024   Lab Results  Component Value Date   HGBA1C 6.5 (H) 06/21/2024   Lab Results  Component Value Date   CHOL 201 (H) 06/21/2024   HDL 92 06/21/2024   LDLCALC 92 06/21/2024   TRIG 84 06/21/2024   CHOLHDL 2.2 06/21/2024    Significant Diagnostic Results in last 30 days:  No results found.  Assessment/Plan  Type 2 diabetes mellitus, poorly controlled,  intolerant to metformin  Poorly controlled diabetes with intolerance to metformin , causing significant gastrointestinal distress. Reports nausea and vomiting with metformin  use. Currently not monitoring blood glucose regularly. Dietary habits include high intake of sweets and irregular meals, which may contribute to poor glycemic control. Declined diabetes education and nutritionist referral. - Discontinue metformin  - Start glimepiride  1 mg daily - Encourage regular blood glucose monitoring - Discuss dietary modifications to reduce sweets and increase regular meals  Hypertension Hypertension managed with hydrochlorothiazide , losartan , and  clonidine . Reports fatigue possibly related to medication regimen. Not currently monitoring blood pressure at home due to broken machine. - Continue current antihypertensive regimen - Encourage home blood pressure monitoring once machine is repaired  Gastroesophageal reflux disease with dysphagia, refractory to omeprazole  Refractory GERD with dysphagia, particularly at night. Omeprazole  is ineffective. Symptoms include sensation of food being stuck and regurgitation. Referral to gastroenterology for further evaluation is planned. - Refer to gastroenterologist for evaluation of dysphagia and GERD  Chronic allergic rhinitis, severe Severe chronic allergic rhinitis with nasal congestion and swelling. Currently managed with Flonase  and Zyrtec. Symptoms exacerbated by smoking. - Continue Flonase  and Zyrtec  Chronic right shoulder pain Chronic right shoulder pain with previous fracture. Reports significant pain and previous denial of MRI by insurance. No current numbness or tingling reported. - Continue current pain management regimen - Discuss insurance issues with patient  Chronic right knee pain Chronic right knee pain with previous fracture. Reports of knee popping and grinding. - Continue current pain management regimen  Chronic musculoskeletal  pain Chronic musculoskeletal pain affecting neck, back, and right foot. Reports of pain and discomfort, particularly in the right foot with a noticeable knot. - Continue current pain management regimen  Hyperlipidemia Hyperlipidemia managed with atorvastatin . - Continue atorvastatin   Adult Wellness Visit Routine follow-up for adult wellness. Issues with transportation noted, affecting ability to complete blood work. - Order fasting blood work - Perform urine specimen collection - Schedule six-month follow-up appointment   Family/ staff Communication: Reviewed plan of care with patient  Labs/tests ordered:  - CBC/diff  - urine microalbumin Creatinine ratio  Next Appointment : Return in about 6 months (around 12/22/2024) for medical mangement of chronic issues.SABRA   Spent 30 minutes of Face to face and non-face to face with patient  >50% time spent counseling; reviewing medical record; tests; labs; documentation and developing future plan of care.   Roxan JAYSON Plough, NP

## 2024-06-29 ENCOUNTER — Ambulatory Visit: Payer: Self-pay | Admitting: Family

## 2024-06-29 DIAGNOSIS — R748 Abnormal levels of other serum enzymes: Secondary | ICD-10-CM

## 2024-07-11 ENCOUNTER — Other Ambulatory Visit: Payer: Self-pay

## 2024-07-11 ENCOUNTER — Other Ambulatory Visit: Payer: Self-pay | Admitting: Family

## 2024-07-11 DIAGNOSIS — I10 Essential (primary) hypertension: Secondary | ICD-10-CM

## 2024-07-11 MED ORDER — HYDROCHLOROTHIAZIDE 25 MG PO TABS
25.0000 mg | ORAL_TABLET | Freq: Every day | ORAL | 1 refills | Status: AC
  Filled 2024-07-11: qty 90, 90d supply, fill #0
  Filled 2024-10-11: qty 90, 90d supply, fill #1

## 2024-07-27 ENCOUNTER — Other Ambulatory Visit: Payer: Self-pay

## 2024-07-31 ENCOUNTER — Other Ambulatory Visit

## 2024-07-31 DIAGNOSIS — R748 Abnormal levels of other serum enzymes: Secondary | ICD-10-CM

## 2024-08-02 ENCOUNTER — Other Ambulatory Visit (HOSPITAL_COMMUNITY): Payer: Self-pay

## 2024-08-10 ENCOUNTER — Other Ambulatory Visit: Payer: Self-pay

## 2024-08-10 ENCOUNTER — Other Ambulatory Visit: Payer: Self-pay | Admitting: Family

## 2024-08-10 DIAGNOSIS — M79605 Pain in left leg: Secondary | ICD-10-CM

## 2024-08-10 DIAGNOSIS — K219 Gastro-esophageal reflux disease without esophagitis: Secondary | ICD-10-CM

## 2024-08-10 MED ORDER — OMEPRAZOLE 40 MG PO CPDR
40.0000 mg | DELAYED_RELEASE_CAPSULE | Freq: Every day | ORAL | 1 refills | Status: DC
  Filled 2024-08-10: qty 30, 30d supply, fill #0
  Filled 2024-09-13: qty 30, 30d supply, fill #1
  Filled 2024-10-11: qty 30, 30d supply, fill #2

## 2024-08-10 MED ORDER — MELOXICAM 15 MG PO TABS
15.0000 mg | ORAL_TABLET | Freq: Every day | ORAL | 11 refills | Status: AC
  Filled 2024-08-10: qty 30, 30d supply, fill #0
  Filled 2024-08-22 – 2024-09-13 (×2): qty 30, 30d supply, fill #1
  Filled 2024-10-11: qty 30, 30d supply, fill #2
  Filled 2024-11-03: qty 30, 30d supply, fill #3
  Filled 2024-12-08: qty 30, 30d supply, fill #4

## 2024-08-10 NOTE — Telephone Encounter (Signed)
Patient medication has warnings. Medication pend and sent to PCP Ngetich, Donalee Citrin, NP for approval.

## 2024-08-11 ENCOUNTER — Other Ambulatory Visit: Payer: Self-pay

## 2024-08-21 ENCOUNTER — Encounter: Payer: Self-pay | Admitting: Physician Assistant

## 2024-08-22 ENCOUNTER — Other Ambulatory Visit: Payer: Self-pay | Admitting: Family

## 2024-08-22 ENCOUNTER — Other Ambulatory Visit: Payer: Self-pay

## 2024-08-22 DIAGNOSIS — E119 Type 2 diabetes mellitus without complications: Secondary | ICD-10-CM

## 2024-08-22 DIAGNOSIS — I10 Essential (primary) hypertension: Secondary | ICD-10-CM

## 2024-08-22 MED ORDER — LOSARTAN POTASSIUM 25 MG PO TABS
25.0000 mg | ORAL_TABLET | Freq: Every day | ORAL | 1 refills | Status: AC
  Filled 2024-08-22 – 2024-10-11 (×2): qty 90, 90d supply, fill #0

## 2024-08-23 ENCOUNTER — Ambulatory Visit: Admitting: Physician Assistant

## 2024-08-23 ENCOUNTER — Other Ambulatory Visit: Payer: Self-pay

## 2024-09-08 ENCOUNTER — Ambulatory Visit: Admitting: Physician Assistant

## 2024-09-08 NOTE — Progress Notes (Deleted)
 09/08/2024 Nina Clark [id number] 1966-03-26  Referring provider: Leonarda Roxan BROCKS, NP Primary GI doctor: {[id number]}  ASSESSMENT AND PLAN:  GERD with dysphagia  Elevated liver function Elevated 2021 and most recently 06/21/2024 AST 36, ALT 33, alkaline phosphatase 74, total bilirubin 0.6, platelets 393  Type 2 diabetes  CCS 09/21/2022 Cologuard negative  Patient Care Team: Ngetich, Roxan BROCKS, NP as PCP - General (Family Medicine)  HISTORY OF PRESENT ILLNESS: 58 y.o. female with a past medical history listed below presents for evaluation of ***.   *** Discussed the use of AI scribe software for clinical note transcription with the patient, who gave verbal consent to proceed.  History of Present Illness            She  reports that she has been smoking cigarettes. She has a 6.3 pack-year smoking history. She has never used smokeless tobacco. She reports current drug use. Drug: Marijuana. She reports that she does not drink alcohol.  RELEVANT GI HISTORY, IMAGING AND LABS: Results          CBC    Component Value Date/Time   WBC 5.2 06/21/2024 1423   RBC 4.30 06/21/2024 1423   HGB 13.2 06/21/2024 1423   HGB 12.0 02/11/2022 1021   HCT 39.9 06/21/2024 1423   HCT 35.4 02/11/2022 1021   PLT 393 06/21/2024 1423   PLT 320 02/11/2022 1021   MCV 92.8 06/21/2024 1423   MCV 89 02/11/2022 1021   MCH 30.7 06/21/2024 1423   MCHC 33.1 06/21/2024 1423   RDW 13.3 06/21/2024 1423   RDW 13.4 02/11/2022 1021   LYMPHSABS 1,907 09/15/2022 0807   LYMPHSABS 1.9 01/22/2020 0945   MONOABS 0.3 01/30/2016 0945   EOSABS 62 06/21/2024 1423   EOSABS 0.1 01/22/2020 0945   BASOSABS 42 06/21/2024 1423   BASOSABS 0.1 01/22/2020 0945   Recent Labs    10/05/23 1415 06/21/24 1423  HGB 13.1 13.2    CMP     Component Value Date/Time   NA 136 06/21/2024 1423   NA 142 02/11/2022 1021   K 4.0 06/21/2024 1423   CL 96 (L) 06/21/2024 1423   CO2 34 (H) 06/21/2024 1423   GLUCOSE 107  (H) 06/21/2024 1423   BUN 17 06/21/2024 1423   BUN 14 02/11/2022 1021   CREATININE 0.77 06/21/2024 1423   CALCIUM  10.2 06/21/2024 1423   PROT 8.5 (H) 06/21/2024 1423   PROT 8.0 08/20/2021 1554   ALBUMIN 4.8 08/20/2021 1554   AST 36 (H) 06/21/2024 1423   ALT 33 (H) 06/21/2024 1423   ALKPHOS 74 08/20/2021 1554   BILITOT 0.6 06/21/2024 1423   BILITOT 0.4 08/20/2021 1554   GFRNONAA 83 08/05/2020 1127   GFRNONAA 94 08/09/2017 1224   GFRAA 95 08/05/2020 1127   GFRAA 109 08/09/2017 1224      Latest Ref Rng & Units 06/21/2024    2:23 PM 10/05/2023    2:15 PM 09/15/2022    8:07 AM  Hepatic Function  Total Protein 6.1 - 8.1 g/dL 8.5  7.9  7.3   AST 10 - 35 U/L 36  16  21   ALT 6 - 29 U/L 33  11  20   Total Bilirubin 0.2 - 1.2 mg/dL 0.6  0.9  0.5       Current Medications:   Current Outpatient Medications (Endocrine & Metabolic):    glimepiride  (AMARYL ) 1 MG tablet, Take 1 tablet (1 mg total) by mouth daily with breakfast.  Current  Outpatient Medications (Cardiovascular):    atorvastatin  (LIPITOR) 10 MG tablet, Take 1 tablet (10 mg total) by mouth daily.   cloNIDine  (CATAPRES ) 0.1 MG tablet, Take 1 tablet (0.1 mg total) by mouth 2 (two) times daily.PATIENT NEEDS APPOINTMENT   hydrochlorothiazide  (HYDRODIURIL ) 25 MG tablet, Take 1 tablet (25 mg total) by mouth daily.   losartan  (COZAAR ) 25 MG tablet, Take 1 tablet (25 mg total) by mouth daily.  Current Outpatient Medications (Respiratory):    fluticasone  (FLONASE ) 50 MCG/ACT nasal spray, Place 2 sprays into both nostrils daily.  Current Outpatient Medications (Analgesics):    aspirin  EC 81 MG tablet, Take 1 tablet (81 mg total) by mouth daily. Swallow whole.   meloxicam  (MOBIC ) 15 MG tablet, Take 1 tablet (15 mg total) by mouth daily.   Current Outpatient Medications (Other):    Blood Glucose Monitoring Suppl (BLOOD GLUCOSE MONITOR SYSTEM) w/Device KIT, Testing in the morning, at noon, and at bedtime.   Glucose Blood (BLOOD  GLUCOSE TEST STRIPS) STRP, Testing in the morning, at noon, and at bedtime.   meclizine  (ANTIVERT ) 25 MG tablet, Take 1 tablet (25 mg total) by mouth 3 (three) times daily as needed for dizziness.   methocarbamol  (ROBAXIN ) 500 MG tablet, Take 1 tablet (500 mg total) by mouth at bedtime as needed.   omeprazole  (PRILOSEC) 40 MG capsule, Take 1 capsule (40 mg total) by mouth daily.  Medical History:  Past Medical History:  Diagnosis Date   Carpal tunnel syndrome on both sides    Chronic foot pain, left    History of vertigo    Hyperglycemia 01/3020   Hypertension    Prediabetes 01/2020   Allergies:  Allergies  Allergen Reactions   Ibuprofen  Shortness Of Breath     Surgical History:  She  has a past surgical history that includes Eye surgery. Family History:  Her family history includes Diabetes in her father and sister; Hypertension in her father and mother.  REVIEW OF SYSTEMS  : All other systems reviewed and negative except where noted in the History of Present Illness.  PHYSICAL EXAM: There were no vitals taken for this visit. Physical Exam          Nina JONELLE Coombs, PA-C 7:47 AM

## 2024-09-13 ENCOUNTER — Other Ambulatory Visit: Payer: Self-pay | Admitting: Family

## 2024-09-13 ENCOUNTER — Other Ambulatory Visit: Payer: Self-pay

## 2024-09-13 DIAGNOSIS — M25511 Pain in right shoulder: Secondary | ICD-10-CM

## 2024-09-13 MED ORDER — METHOCARBAMOL 500 MG PO TABS
500.0000 mg | ORAL_TABLET | Freq: Every evening | ORAL | 1 refills | Status: AC | PRN
  Filled 2024-09-13: qty 30, 30d supply, fill #0

## 2024-09-14 ENCOUNTER — Other Ambulatory Visit: Payer: Self-pay

## 2024-09-21 ENCOUNTER — Other Ambulatory Visit: Payer: Self-pay

## 2024-09-29 ENCOUNTER — Other Ambulatory Visit: Payer: Self-pay

## 2024-09-29 ENCOUNTER — Other Ambulatory Visit (HOSPITAL_COMMUNITY): Payer: Self-pay

## 2024-10-11 ENCOUNTER — Other Ambulatory Visit: Payer: Self-pay

## 2024-10-11 ENCOUNTER — Other Ambulatory Visit: Payer: Self-pay | Admitting: Family

## 2024-10-11 DIAGNOSIS — E785 Hyperlipidemia, unspecified: Secondary | ICD-10-CM

## 2024-10-11 DIAGNOSIS — E119 Type 2 diabetes mellitus without complications: Secondary | ICD-10-CM

## 2024-10-11 MED ORDER — ATORVASTATIN CALCIUM 10 MG PO TABS
10.0000 mg | ORAL_TABLET | Freq: Every day | ORAL | 3 refills | Status: AC
  Filled 2024-10-11: qty 90, 90d supply, fill #0

## 2024-10-12 ENCOUNTER — Other Ambulatory Visit: Payer: Self-pay

## 2024-10-24 NOTE — Progress Notes (Signed)
 "    10/25/2024 Nina Clark [id number] December 29, 1965  Referring provider: Leonarda Roxan BROCKS, NP Primary GI doctor: Dr. Suzann  ASSESSMENT AND PLAN:  Dysphagia with GERD x 6 months Symptoms worse after DM pill that causes severe vomiting On mobic  15 mg daily x 2012, no other NSAIDS, no ETOH She does smokes daily, 1/2 pack a day, 20 pack year smoking, marijuana 2-3 x a week On omeprazole  40 mg x years, not helping Possible esophagitis from vomiting, rule out PUD with NSAIDS, stricture, cancer with smoking history, gastritis, etc - increase omeprazole  40 mg to BID - get EGD with possible dilatation, I discussed risks of EGD with patient today, including risk of sedation, bleeding or perforation.  Patient provides understanding and gave verbal consent to proceed. - get RUQ US  -will give reflux gourmet - consider barium swallow if EGD negative  Elevated LFTs    Latest Ref Rng & Units 06/21/2024    2:23 PM 10/05/2023    2:15 PM 09/15/2022    8:07 AM  Hepatic Function  Total Protein 6.1 - 8.1 g/dL 8.5  7.9  7.3   AST 10 - 35 U/L 36  16  21   ALT 6 - 29 U/L 33  11  20   Total Bilirubin 0.2 - 1.2 mg/dL 0.6  0.9  0.5    Platelets 393  05/2018 hepatitis C 06/21/2024 AST 36 ALT 33 alk phos 74 total bili 0.6 Remote previous elevation 2021 10/25/2024 FIB4 = 1.01 -Check CBC, LFTs, calculate FIB4 -Get RUQ US  to evaluate, consider fibrosure/hepatocellular work up pending results - need LFTs and CBC monitored every 6 months, - revaluation with imaging every 2-3 years.  -Continue to work on risk factor modification including diet exercise and control of risk factors including blood sugars.  CCS  09/21/2022 negative cologuard Recall 3 years  Smoking cessation Discussed risks associated with tobacco use and advised to quit. Information given to the patient, discuss with PCP.   Patient Care Team: Ngetich, Dinah C, NP as PCP - General (Family Medicine)  HISTORY OF PRESENT ILLNESS: 58 y.o.  female with a past medical history listed below presents for evaluation of dysphagia.   Discussed the use of AI scribe software for clinical note transcription with the patient, who gave verbal consent to proceed.  History of Present Illness   Nina Clark is a 58 year old female with diabetes and chronic NSAID use who presents with persistent dysphagia.  For six months, she has experienced persistent dysphagia, describing a sensation of food not passing and hanging around in her throat, with episodes of food regurgitation, particularly at night. No odynophagia. Symptom onset coincided with initiation of a diabetes medication, after which she developed nightly vomiting and choking episodes, with vomitus sometimes trickling out the sides of her mouth. She discontinued the diabetes medication after these symptoms began. Symptoms fluctuate in severity and are worse at night, interfering with sleep. She has experienced weight loss, particularly during the period of frequent vomiting, and has not regained the lost weight.  Reflux symptoms have worsened over the past six months. She takes omeprazole  40 mg daily without interruption. Nocturnal reflux and regurgitation are particularly disruptive. She denies chest pain. No prior colonoscopy or endoscopy. Cologuard test in November 2023 was negative.  She has taken meloxicam  daily since 2012 and avoids other NSAIDs due to suspected allergy. She was prescribed prednisone  twice for left leg swelling, most recently in November 2023, but declined further courses due to lack of  benefit and preference to avoid additional medications.  She denies alcohol use. She smokes approximately four cigarettes daily for over thirty years and uses marijuana about three times per week. No family history of liver disease, colon cancer, stomach cancer, or gallbladder disease.  Bowel movements are regular, aided by high apple juice intake. She occasionally has dark stools, which she  attributes to chocolate consumption, and denies hematochezia or abdominal pain. She reports night sweats, which she describes as hot flashes. She reports chronic ear congestion and occasional mild exertional dyspnea, which she attributes to smoking. No fevers or chills. She has noticed fluid accumulation in her left leg.  Elevated liver function tests were noted in August 2025, with no prior history of liver disease or alcohol use.        She  reports that she has been smoking cigarettes. She has a 6.3 pack-year smoking history. She has never used smokeless tobacco. She reports current drug use. Drug: Marijuana. She reports that she does not drink alcohol.  RELEVANT GI HISTORY, IMAGING AND LABS: Results   Labs Liver function panel (06/21/2024): Elevated      CBC    Component Value Date/Time   WBC 5.2 06/21/2024 1423   RBC 4.30 06/21/2024 1423   HGB 13.2 06/21/2024 1423   HGB 12.0 02/11/2022 1021   HCT 39.9 06/21/2024 1423   HCT 35.4 02/11/2022 1021   PLT 393 06/21/2024 1423   PLT 320 02/11/2022 1021   MCV 92.8 06/21/2024 1423   MCV 89 02/11/2022 1021   MCH 30.7 06/21/2024 1423   MCHC 33.1 06/21/2024 1423   RDW 13.3 06/21/2024 1423   RDW 13.4 02/11/2022 1021   LYMPHSABS 1,907 09/15/2022 0807   LYMPHSABS 1.9 01/22/2020 0945   MONOABS 0.3 01/30/2016 0945   EOSABS 62 06/21/2024 1423   EOSABS 0.1 01/22/2020 0945   BASOSABS 42 06/21/2024 1423   BASOSABS 0.1 01/22/2020 0945   Recent Labs    06/21/24 1423  HGB 13.2    CMP     Component Value Date/Time   NA 136 06/21/2024 1423   NA 142 02/11/2022 1021   K 4.0 06/21/2024 1423   CL 96 (L) 06/21/2024 1423   CO2 34 (H) 06/21/2024 1423   GLUCOSE 107 (H) 06/21/2024 1423   BUN 17 06/21/2024 1423   BUN 14 02/11/2022 1021   CREATININE 0.77 06/21/2024 1423   CALCIUM  10.2 06/21/2024 1423   PROT 8.5 (H) 06/21/2024 1423   PROT 8.0 08/20/2021 1554   ALBUMIN 4.8 08/20/2021 1554   AST 36 (H) 06/21/2024 1423   ALT 33 (H)  06/21/2024 1423   ALKPHOS 74 08/20/2021 1554   BILITOT 0.6 06/21/2024 1423   BILITOT 0.4 08/20/2021 1554   GFRNONAA 83 08/05/2020 1127   GFRNONAA 94 08/09/2017 1224   GFRAA 95 08/05/2020 1127   GFRAA 109 08/09/2017 1224      Latest Ref Rng & Units 06/21/2024    2:23 PM 10/05/2023    2:15 PM 09/15/2022    8:07 AM  Hepatic Function  Total Protein 6.1 - 8.1 g/dL 8.5  7.9  7.3   AST 10 - 35 U/L 36  16  21   ALT 6 - 29 U/L 33  11  20   Total Bilirubin 0.2 - 1.2 mg/dL 0.6  0.9  0.5       Current Medications:   Current Outpatient Medications (Endocrine & Metabolic):    glimepiride  (AMARYL ) 1 MG tablet, Take 1 tablet (1 mg total)  by mouth daily with breakfast.  Current Outpatient Medications (Cardiovascular):    atorvastatin  (LIPITOR) 10 MG tablet, Take 1 tablet (10 mg total) by mouth daily.   cloNIDine  (CATAPRES ) 0.1 MG tablet, Take 1 tablet (0.1 mg total) by mouth 2 (two) times daily.PATIENT NEEDS APPOINTMENT   hydrochlorothiazide  (HYDRODIURIL ) 25 MG tablet, Take 1 tablet (25 mg total) by mouth daily.   losartan  (COZAAR ) 25 MG tablet, Take 1 tablet (25 mg total) by mouth daily.  Current Outpatient Medications (Respiratory):    fluticasone  (FLONASE ) 50 MCG/ACT nasal spray, Place 2 sprays into both nostrils daily.  Current Outpatient Medications (Analgesics):    meloxicam  (MOBIC ) 15 MG tablet, Take 1 tablet (15 mg total) by mouth daily.   aspirin  EC 81 MG tablet, Take 1 tablet (81 mg total) by mouth daily. Swallow whole. (Patient not taking: Reported on 10/25/2024)  Current Outpatient Medications (Other):    Blood Glucose Monitoring Suppl (BLOOD GLUCOSE MONITOR SYSTEM) w/Device KIT, Testing in the morning, at noon, and at bedtime.   Glucose Blood (BLOOD GLUCOSE TEST STRIPS) STRP, Testing in the morning, at noon, and at bedtime.   meclizine  (ANTIVERT ) 25 MG tablet, Take 1 tablet (25 mg total) by mouth 3 (three) times daily as needed for dizziness.   methocarbamol  (ROBAXIN ) 500 MG  tablet, Take 1 tablet (500 mg total) by mouth at bedtime as needed.   omeprazole  (PRILOSEC) 40 MG capsule, Take 1 capsule (40 mg total) by mouth 2 (two) times daily.  Medical History:  Past Medical History:  Diagnosis Date   Carpal tunnel syndrome on both sides    Chronic foot pain, left    History of vertigo    Hyperglycemia 01/3020   Hypertension    Prediabetes 01/2020   Allergies: Allergies[1]   Surgical History:  She  has a past surgical history that includes Eye surgery. Family History:  Her family history includes Diabetes in her father and sister; Hypertension in her father and mother.  REVIEW OF SYSTEMS  : All other systems reviewed and negative except where noted in the History of Present Illness.  PHYSICAL EXAM: BP 100/68   Pulse 93   Ht 5' 4 (1.626 m)   Wt 166 lb (75.3 kg)   BMI 28.49 kg/m  Physical Exam   GENERAL APPEARANCE: Well nourished, in no apparent distress. HEENT: No cervical lymphadenopathy, unremarkable thyroid, sclerae anicteric, conjunctiva pink. RESPIRATORY: Respiratory effort normal, breath sounds clear to auscultation bilaterally without rales, rhonchi, or wheezing. CARDIO: Regular rate and rhythm with no murmurs, rubs, or gallops, peripheral pulses intact. ABDOMEN: Soft, non-distended, active bowel sounds in all four quadrants, non-tender to palpation, no rebound, no mass appreciated. RECTAL: Declines. MUSCULOSKELETAL: Full range of motion, normal gait, without edema. SKIN: Dry, intact without rashes or lesions. No jaundice. NEURO: Alert, oriented, no focal deficits. PSYCH: Cooperative, normal mood and affect.      Alan JONELLE Coombs, PA-C 12:06 PM      [1]  Allergies Allergen Reactions   Ibuprofen  Shortness Of Breath   "

## 2024-10-25 ENCOUNTER — Other Ambulatory Visit

## 2024-10-25 ENCOUNTER — Ambulatory Visit: Admitting: Physician Assistant

## 2024-10-25 ENCOUNTER — Other Ambulatory Visit: Payer: Self-pay

## 2024-10-25 ENCOUNTER — Encounter: Payer: Self-pay | Admitting: Physician Assistant

## 2024-10-25 ENCOUNTER — Telehealth: Payer: Self-pay

## 2024-10-25 VITALS — BP 100/68 | HR 93 | Ht 64.0 in | Wt 166.0 lb

## 2024-10-25 DIAGNOSIS — F17210 Nicotine dependence, cigarettes, uncomplicated: Secondary | ICD-10-CM

## 2024-10-25 DIAGNOSIS — R7989 Other specified abnormal findings of blood chemistry: Secondary | ICD-10-CM

## 2024-10-25 DIAGNOSIS — F17200 Nicotine dependence, unspecified, uncomplicated: Secondary | ICD-10-CM

## 2024-10-25 DIAGNOSIS — R1310 Dysphagia, unspecified: Secondary | ICD-10-CM

## 2024-10-25 DIAGNOSIS — K219 Gastro-esophageal reflux disease without esophagitis: Secondary | ICD-10-CM

## 2024-10-25 LAB — CBC WITH DIFFERENTIAL/PLATELET
Basophils Absolute: 0.1 K/uL (ref 0.0–0.1)
Basophils Relative: 0.8 % (ref 0.0–3.0)
Eosinophils Absolute: 0.1 K/uL (ref 0.0–0.7)
Eosinophils Relative: 1.8 % (ref 0.0–5.0)
HCT: 37.2 % (ref 36.0–46.0)
Hemoglobin: 12.7 g/dL (ref 12.0–15.0)
Lymphocytes Relative: 27.5 % (ref 12.0–46.0)
Lymphs Abs: 1.8 K/uL (ref 0.7–4.0)
MCHC: 34.1 g/dL (ref 30.0–36.0)
MCV: 89.1 fl (ref 78.0–100.0)
Monocytes Absolute: 0.4 K/uL (ref 0.1–1.0)
Monocytes Relative: 6.2 % (ref 3.0–12.0)
Neutro Abs: 4.1 K/uL (ref 1.4–7.7)
Neutrophils Relative %: 63.7 % (ref 43.0–77.0)
Platelets: 351 K/uL (ref 150.0–400.0)
RBC: 4.18 Mil/uL (ref 3.87–5.11)
RDW: 13.5 % (ref 11.5–15.5)
WBC: 6.4 K/uL (ref 4.0–10.5)

## 2024-10-25 LAB — COMPREHENSIVE METABOLIC PANEL WITH GFR
ALT: 24 U/L (ref 3–35)
AST: 29 U/L (ref 5–37)
Albumin: 4.5 g/dL (ref 3.5–5.2)
Alkaline Phosphatase: 65 U/L (ref 39–117)
BUN: 14 mg/dL (ref 6–23)
CO2: 33 meq/L — ABNORMAL HIGH (ref 19–32)
Calcium: 9.9 mg/dL (ref 8.4–10.5)
Chloride: 96 meq/L (ref 96–112)
Creatinine, Ser: 0.84 mg/dL (ref 0.40–1.20)
GFR: 75.33 mL/min
Glucose, Bld: 68 mg/dL — ABNORMAL LOW (ref 70–99)
Potassium: 3.3 meq/L — ABNORMAL LOW (ref 3.5–5.1)
Sodium: 137 meq/L (ref 135–145)
Total Bilirubin: 0.5 mg/dL (ref 0.2–1.2)
Total Protein: 7.9 g/dL (ref 6.0–8.3)

## 2024-10-25 MED ORDER — OMEPRAZOLE 40 MG PO CPDR
40.0000 mg | DELAYED_RELEASE_CAPSULE | Freq: Two times a day (BID) | ORAL | 1 refills | Status: AC
  Filled 2024-10-25 – 2024-11-03 (×2): qty 180, 90d supply, fill #0

## 2024-10-25 NOTE — Patient Instructions (Addendum)
 Your provider has requested that you go to the basement level for lab work before leaving today. Press B on the elevator. The lab is located at the first door on the left as you exit the elevator.  You have been scheduled for an abdominal ultrasound at [facility] (1st floor of hospital) on 11-01-24 at 9am. Please arrive 15 minutes prior to your appointment for registration. Make certain not to have anything to eat or drink midnight prior to your appointment. Should you need to reschedule your appointment, please contact radiology at [phone]. This test typically takes about 30 minutes to perform.  You have been scheduled for an endoscopy. Please follow written instructions given to you at your visit today.  If you use inhalers (even only as needed), please bring them with you on the day of your procedure.  If you take any of the following medications, they will need to be adjusted prior to your procedure:   DO NOT TAKE 7 DAYS PRIOR TO TEST- Trulicity (dulaglutide) Ozempic, Wegovy (semaglutide) Mounjaro, Zepbound (tirzepatide) Bydureon Bcise (exanatide extended release)  DO NOT TAKE 1 DAY PRIOR TO YOUR TEST Rybelsus (semaglutide) Adlyxin (lixisenatide) Victoza (liraglutide) Byetta (exanatide) ___________________________________________________________________________    Please take your proton pump inhibitor medication, omeprazole  40 mg TWICE a day, Please take this medication 30 minutes to 1 hour before meals- this makes it more effective. Do this for at least 3 months Avoid spicy and acidic foods Avoid fatty foods Limit your intake of coffee, tea, alcohol, and carbonated drinks Work to maintain a healthy weight Keep the head of the bed elevated at least 3 inches with blocks or a wedge pillow if you are having any nighttime symptoms Stay upright for 2 hours after eating Avoid meals and snacks three to four hours before bedtime Stop smoking  Reflux Gourmet  Rescue  It is an ALGINATE THERAPY which is the only intervention that works to safeguard the esophagus by creating a protective barrier that actually stops reflux from happening. -The general directions for use are as stated on the packaging: Take 1 teaspoon (5 ml), or more as needed or as directed by your physician, after meals and before bed. -These general directions address the most common times for reflux to occur, but our Rescue products may be taken anytime. Some individuals may take our product preemptively, when they know they will suffer from reflux, or as needed - when discomfort arises. (If taken around food, it should be consumed last.) -You do not have to take 1 teaspoon (5 ml) of the product. While one teaspoon (5ml) may be the perfect average amount to relieve reflux suffering in some, others may require more or less. You may adjust the amount of Mint Chocolate Rescue and Vanilla Caramel Rescue to the lowest amount necessary to meet your individual needs to improve your quality of life. -You may dilute the product if it is too viscous for you to consume. Keep in mind, however, that the thickness of the product was formulated to provide optimal coating and protection of your throat and esophagus. Though diluting the product is possible, it may reduce the protective function and/or length of action. -This can be used in conjunction with reflux medications and lifestyle changes.  100% ALL-NATURAL  Paraben FREE, glycerin FREE, & potassium FREE  Made entirely from all-natural ingredients considered safe for children and during pregnancy  No known side effects  All-natural flavor Gluten FREE  Allergen FREE  Vegan  Can find more information here:  [email or web address]-science-of-[email or web address]

## 2024-10-25 NOTE — Telephone Encounter (Signed)
 Patient said she can't stop her Mobic  a week prior to the procedure that is scheduled 11-13-24. She uses it daily if not she she will have a sharp/throbbing pain that will radiate up her leg and she said she can't do tylenol. Please advise

## 2024-10-26 LAB — HEPATITIS B SURFACE ANTIBODY,QUALITATIVE: Hep B S Ab: NONREACTIVE

## 2024-10-26 LAB — TISSUE TRANSGLUTAMINASE, IGA: (tTG) Ab, IgA: <1 U/mL

## 2024-10-26 LAB — HEPATITIS A ANTIBODY, TOTAL: Hepatitis A AB,Total: NONREACTIVE

## 2024-10-26 LAB — HEPATITIS C ANTIBODY: Hepatitis C Ab: NONREACTIVE

## 2024-10-26 LAB — HEPATITIS B SURFACE ANTIGEN: Hepatitis B Surface Ag: NONREACTIVE

## 2024-10-26 LAB — IGA: Immunoglobulin A: 273 mg/dL (ref 47–310)

## 2024-10-27 NOTE — Telephone Encounter (Signed)
 LVM and my chart message

## 2024-10-30 ENCOUNTER — Ambulatory Visit: Payer: Self-pay | Admitting: Physician Assistant

## 2024-10-30 NOTE — Telephone Encounter (Signed)
LVM and letter sent.

## 2024-10-31 NOTE — Telephone Encounter (Signed)
 Patient said to cancel the procedure because she can't go off the mobic . Was told to call back with any concerns or questions and she said she will because she already told to up her dose of medication first so she will try that first before anything else and she is aware about her ultrasound appointment for the 31st as well

## 2024-11-01 ENCOUNTER — Ambulatory Visit (HOSPITAL_COMMUNITY): Admission: RE | Admit: 2024-11-01 | Source: Ambulatory Visit

## 2024-11-03 ENCOUNTER — Other Ambulatory Visit: Payer: Self-pay | Admitting: Family

## 2024-11-03 ENCOUNTER — Other Ambulatory Visit: Payer: Self-pay

## 2024-11-03 DIAGNOSIS — E119 Type 2 diabetes mellitus without complications: Secondary | ICD-10-CM

## 2024-11-03 MED ORDER — GLIMEPIRIDE 1 MG PO TABS
1.0000 mg | ORAL_TABLET | Freq: Every day | ORAL | 3 refills | Status: AC
  Filled 2024-11-03: qty 30, 30d supply, fill #0
  Filled 2024-11-29: qty 30, 30d supply, fill #1

## 2024-11-13 ENCOUNTER — Encounter: Admitting: Pediatrics

## 2024-11-24 ENCOUNTER — Other Ambulatory Visit: Payer: Self-pay

## 2024-12-08 ENCOUNTER — Other Ambulatory Visit: Payer: Self-pay

## 2024-12-08 ENCOUNTER — Other Ambulatory Visit: Payer: Self-pay | Admitting: Family

## 2024-12-08 DIAGNOSIS — I10 Essential (primary) hypertension: Secondary | ICD-10-CM

## 2024-12-08 MED ORDER — CLONIDINE HCL 0.1 MG PO TABS
0.1000 mg | ORAL_TABLET | Freq: Two times a day (BID) | ORAL | 1 refills | Status: AC
  Filled 2024-12-08: qty 180, 90d supply, fill #0

## 2024-12-22 ENCOUNTER — Ambulatory Visit: Payer: Self-pay | Admitting: Family
